# Patient Record
Sex: Female | Born: 1990 | Race: Black or African American | Hispanic: No | Marital: Single | Smoking: Never smoker
Health system: Southern US, Community
[De-identification: ages and names within clinical notes are randomized; demographics above are authoritative.]

## PROBLEM LIST (undated history)

## (undated) ENCOUNTER — Inpatient Hospital Stay (HOSPITAL_COMMUNITY): Payer: Self-pay

## (undated) DIAGNOSIS — D649 Anemia, unspecified: Secondary | ICD-10-CM

## (undated) DIAGNOSIS — D219 Benign neoplasm of connective and other soft tissue, unspecified: Secondary | ICD-10-CM

## (undated) DIAGNOSIS — K219 Gastro-esophageal reflux disease without esophagitis: Secondary | ICD-10-CM

## (undated) DIAGNOSIS — R55 Syncope and collapse: Secondary | ICD-10-CM

## (undated) DIAGNOSIS — T7840XA Allergy, unspecified, initial encounter: Secondary | ICD-10-CM

## (undated) HISTORY — PX: NO PAST SURGERIES: SHX2092

## (undated) HISTORY — DX: Anemia, unspecified: D64.9

## (undated) HISTORY — DX: Allergy, unspecified, initial encounter: T78.40XA

---
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
## 2010-11-21 LAB — POCT I-STAT, CHEM 8
BUN: 8
Calcium, Ion: 1.24
Sodium: 138
TCO2: 27

## 2010-11-21 LAB — URINALYSIS, ROUTINE W REFLEX MICROSCOPIC
Hgb urine dipstick: NEGATIVE
Ketones, ur: NEGATIVE
Specific Gravity, Urine: 1.013

## 2010-11-21 LAB — URINE MICROSCOPIC-ADD ON

## 2010-11-30 ENCOUNTER — Emergency Department (HOSPITAL_COMMUNITY)
Admission: EM | Admit: 2010-11-30 | Discharge: 2010-11-30 | Disposition: A | Discharge status: Home / Self Care | Payer: Self-pay | Attending: Emergency Medicine | Admitting: Emergency Medicine

## 2010-11-30 DIAGNOSIS — IMO0002 Reserved for concepts with insufficient information to code with codable children: Secondary | ICD-10-CM | POA: Insufficient documentation

## 2011-04-26 ENCOUNTER — Other Ambulatory Visit: Payer: Self-pay

## 2011-04-26 ENCOUNTER — Emergency Department (HOSPITAL_COMMUNITY)
Admission: EM | Admit: 2011-04-26 | Discharge: 2011-04-27 | Disposition: A | Discharge status: Home / Self Care | Payer: Self-pay | Attending: Emergency Medicine | Admitting: Emergency Medicine

## 2011-04-26 DIAGNOSIS — E860 Dehydration: Secondary | ICD-10-CM | POA: Insufficient documentation

## 2011-04-26 DIAGNOSIS — R55 Syncope and collapse: Secondary | ICD-10-CM | POA: Insufficient documentation

## 2011-04-26 DIAGNOSIS — R296 Repeated falls: Secondary | ICD-10-CM | POA: Insufficient documentation

## 2011-04-26 DIAGNOSIS — R51 Headache: Secondary | ICD-10-CM | POA: Insufficient documentation

## 2011-04-26 HISTORY — DX: Syncope and collapse: R55

## 2011-04-27 ENCOUNTER — Other Ambulatory Visit: Payer: Self-pay

## 2011-04-27 ENCOUNTER — Encounter (HOSPITAL_COMMUNITY): Payer: Self-pay

## 2011-04-27 ENCOUNTER — Emergency Department (HOSPITAL_COMMUNITY): Payer: Self-pay

## 2011-04-27 LAB — URINALYSIS, DIPSTICK ONLY
Bilirubin Urine: NEGATIVE
Glucose, UA: NEGATIVE mg/dL
Hgb urine dipstick: NEGATIVE
Ketones, ur: NEGATIVE mg/dL
Leukocytes, UA: NEGATIVE
Nitrite: NEGATIVE

## 2011-04-27 LAB — POCT I-STAT, CHEM 8
Calcium, Ion: 1.22 mmol/L (ref 1.12–1.32)
Glucose, Bld: 112 mg/dL — ABNORMAL HIGH (ref 70–99)
Hemoglobin: 13.3 g/dL (ref 12.0–15.0)
TCO2: 25 mmol/L (ref 0–100)

## 2011-04-27 LAB — POCT PREGNANCY, URINE: Preg Test, Ur: NEGATIVE

## 2011-04-27 MED ORDER — SODIUM CHLORIDE 0.9 % IV BOLUS (SEPSIS): 1000.0000 mL | Freq: Once | INTRAVENOUS | Status: DC

## 2011-04-27 MED ORDER — PROMETHAZINE HCL 25 MG PO TABS: 25.0000 mg | ORAL_TABLET | Freq: Four times a day (QID) | ORAL | Status: DC | PRN

## 2011-04-27 NOTE — ED Provider Notes (Addendum)
History     CSN: [id number]  Arrival date & time 04/26/11  2359   First MD Initiated Contact with Patient 04/27/11 0040      Chief Complaint  Patient presents with  . Loss of Consciousness    (Consider location/radiation/quality/duration/timing/severity/associated sxs/prior treatment) HPI Comments: 21 year old female with history of posterior headaches intermittently presents with a syncopal episode. According to the family member she got up off the couch to go to get a drink of water, turned around to the living room and fell face first into the ground. She states that prior to falling she was feeling nauseated, feeling overheated and lightheaded. She denies palpitations, chest pain, shortness of breath, vertigo, weakness or numbness. She was immediately attended to by her family members, immediately woke up and was able to get up off the ground without any complaints. At this time she has a mild posterior headache, no nausea vomiting, no visual changes, no ataxia. Symptoms are mild, persistent, nothing makes better or  Patient is a 21 y.o. female presenting with syncope. The history is provided by the patient and a relative.  Loss of Consciousness    Past Medical History  Diagnosis Date  . Syncope and collapse     History reviewed. No pertinent past surgical history.  No family history on file.  History  Substance Use Topics  . Smoking status: Never Smoker   . Smokeless tobacco: Not on file  . Alcohol Use: Yes    OB History    Grav Para Term Preterm Abortions TAB SAB Ect Mult Living                  Review of Systems  Cardiovascular: Positive for syncope.  All other systems reviewed and are negative.    Allergies  Review of patient's allergies indicates no known allergies.  Home Medications   Current Outpatient Rx  Name Route Sig Dispense Refill  . PROMETHAZINE HCL 25 MG PO TABS Oral Take 1 tablet (25 mg total) by mouth every 6 (six) hours as needed for  nausea. 12 tablet 0    BP 111/72  Pulse 94  Temp(Src) 98.6 F (37 C) (Oral)  Resp 18  Wt 209 lb (94.802 kg)  SpO2 100%  LMP 04/21/2011  Physical Exam  Nursing note and vitals reviewed. Constitutional: She appears well-developed and well-nourished. No distress.  HENT:  Head: Normocephalic and atraumatic.  Mouth/Throat: Oropharynx is clear and moist. No oropharyngeal exudate.       Mild tenderness to the nasal bridge, no deformity swelling or bruising  Eyes: Conjunctivae and EOM are normal. Pupils are equal, round, and reactive to light. Right eye exhibits no discharge. Left eye exhibits no discharge. No scleral icterus.  Neck: Normal range of motion. Neck supple. No JVD present. No thyromegaly present.  Cardiovascular: Normal rate, regular rhythm, normal heart sounds and intact distal pulses.  Exam reveals no gallop and no friction rub.   No murmur heard. Pulmonary/Chest: Effort normal and breath sounds normal. No respiratory distress. She has no wheezes. She has no rales.  Abdominal: Soft. Bowel sounds are normal. She exhibits no distension and no mass. There is no tenderness.  Musculoskeletal: Normal range of motion. She exhibits no edema and no tenderness.  Lymphadenopathy:    She has no cervical adenopathy.  Neurological: She is alert. Coordination normal.  Skin: Skin is warm and dry. No rash noted. No erythema.  Psychiatric: She has a normal mood and affect. Her behavior is normal.  ED Course  Procedures (including critical care time)  ED ECG REPORT   Date: 04/27/2011   Rate: 85  Rhythm: normal sinus rhythm  QRS Axis: normal  Intervals: normal  ST/T Wave abnormalities: normal  Conduction Disutrbances:none  Narrative Interpretation:   Old EKG Reviewed: none available     Labs Reviewed  POCT I-STAT, CHEM 8 - Abnormal; Notable for the following:    Glucose, Bld 112 (*)    All other components within normal limits  URINALYSIS, DIPSTICK ONLY  POCT PREGNANCY,  URINE   Ct Head Wo Contrast  04/27/2011  *RADIOLOGY REPORT*  Clinical Data: Head and face trauma secondary to a fall due to syncope.  CT HEAD WITHOUT CONTRAST  Technique:  Contiguous axial images were obtained from the base of the skull through the vertex without contrast.  Comparison: None.  Findings: There is no acute intracranial hemorrhage, infarction, or mass.  Brain parenchyma is normal.  Osseous structures are normal.  IMPRESSION: Normal exam.  Original Report Authenticated By: Gwynn Burly, M.D.     1. Dehydration   2. Syncope       MDM  Exam is benign, has mild tenderness to the nasal bridge but doubt fracture. Sounds like she may have had a vagal episode given the symptoms starting as she stood up. We'll proceed with labs, U pregnant, CT head at mother's insistence.  Patient reevaluated and feels good, has slight orthostatic change when stands but declines IV fluids preferring oral rehydration therapy instead. I have personally reviewed her CT scan and labwork and find a significant abnormalities, with normal electrolytes, renal function, specific gravity, CT head.  She will followup with family doctor as needed.      Vida Roller, MD 04/27/11 0210  Vida Roller, MD 04/27/11 [phone]

## 2011-04-27 NOTE — Discharge Instructions (Signed)
That you had a passing out spell was because you are slightly dehydrated. Please slowly get to a standing position, if you feel lightheaded sat down immediately and drink plenty of fluids over the next 24 hours. Return to the emergency department for severe or worsening symptoms including recurrent passing out, chest pain, palpitations  RESOURCE GUIDE  Dental Problems  Patients with Medicaid: [facility] [phone]                                           [phone] [address] Phone:  [phone]                                                  Phone:  [phone]  If unable to pay or uninsured, contact:  [facility] or [facility]. to become qualified for the adult dental clinic.  Chronic Pain Problems Contact [facility]  [phone] Patients need to be referred by their primary care doctor.  Insufficient Money for Medicine Contact [employer]:  call "211" or [facility] [phone].  No Primary Care Doctor Call [facility]  [phone] Other agencies that provide inexpensive medical care    [facility]  [phone]    [facility]  [phone]    [facility]  [phone]    [facility]  [phone]    [facility]  [phone]    [facility]  [phone]  Psychological Services [facility]  [phone] [facility]  [phone] [facility]   [phone] (emergency services [phone])  Substance Abuse Resources [facility]  [phone] [facility] [phone] [facility] [phone] Floydene Flock [phone] Residential & Outpatient Substance Abuse Program  [phone]  Abuse/Neglect [facility] Child Abuse Hotline [phone])446-6411 [facility] Child Abuse Hotline (202) 367-6142 (After Hours)  Emergency Shelter [facility] (512)[phone]  Maternity Homes Room at the [place] [facility] [place] [phone] [facility] [phone]  MRSA Hotline #:   815 100 6054    [facility] Resources  [facility]     [employer]                          [facility] Dept. [address] [place]                       [address]      [address] [place] [address]  [place]                                                Cristobal Goldmann Phone:  [phone]  Phone:  [phone]                 Phone:  [phone]  [place] [facility] Phone:  [phone]  [place] Child Abuse Hotline (336) 342-1394 [phone]) [phone] (After Hours)   

## 2011-04-27 NOTE — ED Notes (Signed)
Mother states pt was asleep in chair awoke and walked into the kitchen and when walking back "passed out"  approx 1 minute- denies neck and back pain and N/V/d and fever

## 2011-04-27 NOTE — ED Notes (Signed)
Pt reports history of syncope about 50 minutes ago. Pt reports she does not remember what happened. Pt says this happened about a year ago when she was working out a lot. Pt reports she had an episode of syncope and was seen and diagnosed with dehydration. Pt denies exercise today. Pt reports pain to nose and head. Pt reports falling on face with episode of syncope. Pt reports she has had a stressful and busy weekend in which she bought a car. Pt family denies any personality changes, changes in speech or thought. Pt had a negative neuro exam and able to ambulate without assistance.

## 2011-09-25 ENCOUNTER — Encounter (HOSPITAL_COMMUNITY): Payer: Self-pay | Admitting: Emergency Medicine

## 2011-09-25 ENCOUNTER — Emergency Department (HOSPITAL_COMMUNITY)
Admission: EM | Admit: 2011-09-25 | Discharge: 2011-09-25 | Disposition: A | Discharge status: Home / Self Care | Payer: Medicaid Other | Attending: Emergency Medicine | Admitting: Emergency Medicine

## 2011-09-25 DIAGNOSIS — L732 Hidradenitis suppurativa: Secondary | ICD-10-CM | POA: Insufficient documentation

## 2011-09-25 MED ORDER — OXYCODONE-ACETAMINOPHEN 5-325 MG PO TABS: 1.0000 | ORAL_TABLET | Freq: Four times a day (QID) | ORAL | Status: DC | PRN

## 2011-09-25 MED ORDER — CEPHALEXIN 500 MG PO CAPS: 500.0000 mg | ORAL_CAPSULE | Freq: Four times a day (QID) | ORAL | Status: DC

## 2011-09-25 MED ORDER — SULFAMETHOXAZOLE-TRIMETHOPRIM 800-160 MG PO TABS: 1.0000 | ORAL_TABLET | Freq: Three times a day (TID) | ORAL | Status: DC

## 2011-09-25 NOTE — ED Provider Notes (Addendum)
History     CSN: [id number]  Arrival date & time 09/25/11  1103   First MD Initiated Contact with Patient 09/25/11 1125      Chief Complaint  Patient presents with  . Abscess    (Consider location/radiation/quality/duration/timing/severity/associated sxs/prior treatment) The history is provided by the patient.  patient with painful swelling to the bilateral axilla for the last few days. History of abscesses previously. No fever. No drainage. No other sites.   Past Medical History  Diagnosis Date  . Syncope and collapse     History reviewed. No pertinent past surgical history.  History reviewed. No pertinent family history.  History  Substance Use Topics  . Smoking status: Never Smoker   . Smokeless tobacco: Not on file  . Alcohol Use: Yes    OB History    Grav Para Term Preterm Abortions TAB SAB Ect Mult Living                  Review of Systems  Constitutional: Negative for fever and fatigue.  Respiratory: Negative for chest tightness.   Cardiovascular: Negative for chest pain.  Musculoskeletal: Negative for back pain.  Skin: Negative for wound.  Hematological: Positive for adenopathy.    Allergies  Review of patient's allergies indicates no known allergies.  Home Medications   Current Outpatient Rx  Name Route Sig Dispense Refill  . ADULT MULTIVITAMIN W/MINERALS CH Oral Take 1 tablet by mouth daily.    . CEPHALEXIN 500 MG PO CAPS Oral Take 1 capsule (500 mg total) by mouth 4 (four) times daily. 20 capsule 0  . OXYCODONE-ACETAMINOPHEN 5-325 MG PO TABS Oral Take 1-2 tablets by mouth every 6 (six) hours as needed for pain. 10 tablet 0  . PROMETHAZINE HCL 25 MG PO TABS Oral Take 25 mg by mouth every 6 (six) hours as needed. For nausea    . SULFAMETHOXAZOLE-TRIMETHOPRIM 800-160 MG PO TABS Oral Take 1 tablet by mouth 3 (three) times daily. 15 tablet 0    BP 109/74  Pulse 103  Temp 97.9 F (36.6 C)  Resp 18  SpO2 100%  Physical Exam  Constitutional:  She appears well-developed and well-nourished.  Pulmonary/Chest: She exhibits no tenderness.  Skin:       Tender indurated areas to bilateral axilla. No fluctuance. Scarring from previous abscesses    ED Course  Procedures (including critical care time)  Labs Reviewed - No data to display No results found.   1. Hidradenitis suppurativa       MDM  Patient with apparent hidradenitis bilateral axilla. No clear abscess any draining. She has a history of same. Antibiotics and pain medicine will be given. She'll followup with [facility] and the ER in 2-3 days as needed for recheck.  I personally performed the services described in this documentation, which was scribed in my presence. The recorded information has been reviewed and considered.          Juliet Rude. Rubin Payor, MD 09/25/11 1156  Juliet Rude. Rubin Payor, MD 11/02/11 2051

## 2011-09-25 NOTE — ED Notes (Signed)
Pt c/o multiple abscess under axillary area; pt sts hx of same

## 2011-10-01 ENCOUNTER — Encounter (HOSPITAL_COMMUNITY): Payer: Self-pay | Admitting: Adult Health

## 2011-10-01 ENCOUNTER — Emergency Department (HOSPITAL_COMMUNITY)
Admission: EM | Admit: 2011-10-01 | Discharge: 2011-10-02 | Disposition: A | Discharge status: Home / Self Care | Payer: Medicaid Other | Attending: Emergency Medicine | Admitting: Emergency Medicine

## 2011-10-01 DIAGNOSIS — L02412 Cutaneous abscess of left axilla: Secondary | ICD-10-CM

## 2011-10-01 DIAGNOSIS — IMO0002 Reserved for concepts with insufficient information to code with codable children: Secondary | ICD-10-CM | POA: Insufficient documentation

## 2011-10-01 MED ORDER — OXYCODONE-ACETAMINOPHEN 5-325 MG PO TABS
2.0000 | ORAL_TABLET | Freq: Once | ORAL | Status: AC
  Administered 2011-10-01: 2 via ORAL
  Filled 2011-10-01: qty 2

## 2011-10-01 NOTE — ED Provider Notes (Signed)
History     CSN: [id number]  Arrival date & time 10/01/11  2301   None     Chief Complaint  Patient presents with  . Abscess    (Consider location/radiation/quality/duration/timing/severity/associated sxs/prior treatment) HPI Comments: Patient has a moderate-sized abscess in her left axilla.  She states she had one in the same area proximally, one year ago.  He denies any trauma, fever, myalgias, drainage  Patient is a 21 y.o. female presenting with abscess. The history is provided by the patient.  Abscess  This is a new problem. The current episode started yesterday. The problem occurs rarely. The problem has been unchanged. Pertinent negatives include no fever.    Past Medical History  Diagnosis Date  . Syncope and collapse     History reviewed. No pertinent past surgical history.  History reviewed. No pertinent family history.  History  Substance Use Topics  . Smoking status: Never Smoker   . Smokeless tobacco: Not on file  . Alcohol Use: Yes    OB History    Grav Para Term Preterm Abortions TAB SAB Ect Mult Living                  Review of Systems  Constitutional: Negative for fever and chills.  Gastrointestinal: Negative for nausea.  Musculoskeletal: Negative for myalgias.  Skin: Positive for wound.    Allergies  Review of patient's allergies indicates no known allergies.  Home Medications   Current Outpatient Rx  Name Route Sig Dispense Refill  . IBUPROFEN 200 MG PO TABS Oral Take 800 mg by mouth every 6 (six) hours as needed. For pain    . ADULT MULTIVITAMIN W/MINERALS CH Oral Take 1 tablet by mouth daily.    Marland Kitchen PROMETHAZINE HCL 25 MG PO TABS Oral Take 25 mg by mouth every 6 (six) hours as needed. For nausea      BP 106/62  Pulse 85  Temp 98.5 F (36.9 C) (Oral)  Resp 16  SpO2 100%  Physical Exam  Constitutional: She appears well-developed and well-nourished.  HENT:  Head: Normocephalic.  Eyes: Pupils are equal, round, and reactive to  light.  Neck: Normal range of motion.  Cardiovascular: Normal rate.   Pulmonary/Chest: Effort normal.  Abdominal: Soft.  Musculoskeletal: Normal range of motion.  Neurological: She is alert.  Skin: Skin is warm.       Small fluctuant abscess, central left axilla    ED Course  INCISION AND DRAINAGE Date/Time: 10/02/2011 12:22 AM Performed by: Arman Filter Authorized by: Arman Filter Consent: Verbal consent obtained. Risks and benefits: risks, benefits and alternatives were discussed Consent given by: patient Patient understanding: patient states understanding of the procedure being performed Patient identity confirmed: verbally with patient Type: abscess Body area: upper extremity Location details: left arm Anesthesia: local infiltration Local anesthetic: lidocaine 2% with epinephrine Anesthetic total: 3 ml Scalpel size: 11 Needle gauge: 22 Complexity: simple Drainage: purulent Drainage amount: copious Wound treatment: drain placed Packing material: 1/4 in iodoform gauze Patient tolerance: Patient tolerated the procedure well with no immediate complications.   (including critical care time)  Labs Reviewed - No data to display No results found.   No diagnosis found.    MDM   Abscess.  Will be I indeed, patient was placed on antibiotics.  She is instructed to hold the packing in 2 days        Arman Filter, NP 10/02/11 4098

## 2011-10-01 NOTE — ED Notes (Signed)
Golf ball sized induration that is under left arm. Yesterday it was 3 small bumps and today it is one. Pt c/o pain 8/10

## 2011-10-01 NOTE — ED Notes (Signed)
Patient complaining of abscess under left arm (axilla rea); patient reports that abscess and pain started yesterday -- abscess has greatly increased in size since last night.  Patient describes pain as "sore" and "tender"; rates pain 8/10 on the numerical pain scale.  Will continue to monitor.

## 2011-10-02 MED ORDER — HYDROCODONE-ACETAMINOPHEN 5-500 MG PO TABS: 1.0000 | ORAL_TABLET | Freq: Four times a day (QID) | ORAL | Status: AC | PRN

## 2011-10-02 NOTE — ED Provider Notes (Signed)
Medical screening examination/treatment/procedure(s) were performed by non-physician practitioner and as supervising physician I was immediately available for consultation/collaboration.  Jasmine Awe, MD 10/02/11 [phone]

## 2011-10-02 NOTE — ED Notes (Signed)
Patient given discharge paperwork; went over discharge instructions with patient.  Patient instructed to remove packing in two days (per FNP), to follow up with primary care physician, and to return to the ED for new, worsening, or concerning symptoms.

## 2012-12-21 ENCOUNTER — Ambulatory Visit (INDEPENDENT_AMBULATORY_CARE_PROVIDER_SITE_OTHER): Payer: BC Managed Care – PPO | Admitting: Physician Assistant

## 2012-12-21 VITALS — BP 112/62 | HR 84 | Temp 98.6°F | Resp 18 | Ht 68.0 in | Wt 227.0 lb

## 2012-12-21 DIAGNOSIS — R062 Wheezing: Secondary | ICD-10-CM

## 2012-12-21 DIAGNOSIS — R059 Cough, unspecified: Secondary | ICD-10-CM

## 2012-12-21 DIAGNOSIS — R05 Cough: Secondary | ICD-10-CM

## 2012-12-21 DIAGNOSIS — J329 Chronic sinusitis, unspecified: Secondary | ICD-10-CM

## 2012-12-21 MED ORDER — CLARITHROMYCIN ER 500 MG PO TB24: 1000.0000 mg | ORAL_TABLET | Freq: Every day | ORAL | Status: DC

## 2012-12-21 MED ORDER — BENZONATATE 100 MG PO CAPS: 100.0000 mg | ORAL_CAPSULE | Freq: Three times a day (TID) | ORAL | Status: DC | PRN

## 2012-12-21 MED ORDER — IPRATROPIUM BROMIDE 0.02 % IN SOLN
0.5000 mg | Freq: Once | RESPIRATORY_TRACT | Status: AC
  Administered 2012-12-21: 0.5 mg via RESPIRATORY_TRACT

## 2012-12-21 MED ORDER — IPRATROPIUM BROMIDE 0.03 % NA SOLN: 2.0000 | Freq: Two times a day (BID) | NASAL | Status: DC

## 2012-12-21 MED ORDER — ALBUTEROL SULFATE HFA 108 (90 BASE) MCG/ACT IN AERS: 2.0000 | INHALATION_SPRAY | RESPIRATORY_TRACT | Status: DC | PRN

## 2012-12-21 MED ORDER — ALBUTEROL SULFATE (2.5 MG/3ML) 0.083% IN NEBU
2.5000 mg | INHALATION_SOLUTION | Freq: Once | RESPIRATORY_TRACT | Status: AC
  Administered 2012-12-21: 2.5 mg via RESPIRATORY_TRACT

## 2012-12-21 NOTE — Patient Instructions (Signed)
Get plenty of rest and drink at least 64 ounces of water daily. 

## 2012-12-21 NOTE — Progress Notes (Signed)
Subjective:    Patient ID: Tiffany Sullivan, female    DOB: 09/03/1990, 22 y.o.   MRN: [id number]  HPI This 22 y.o. female presents for evaluation of respiratory illness. Developed subjective generalized "weakness" and fatigue after work on 12/16/2012.  Then developed cough and congestion.  Cough is productive.  Initially had a sore throat, which has resolved.  Today when she got up, she felt like she was wheezing and couldn't catch a breath.  She was seen at the [facility] on 12/19/2012 and was told she had a virus, a "cold," and to continue OTC Mucinex.    Active Ambulatory Problems    Diagnosis Date Noted  . No Active Ambulatory Problems   Resolved Ambulatory Problems    Diagnosis Date Noted  . No Resolved Ambulatory Problems   Past Medical History  Diagnosis Date  . Syncope and collapse   . Allergy   . Anemia     History reviewed. No pertinent past surgical history.  No Known Allergies  Prior to Admission medications   Medication Sig Start Date End Date Taking? Authorizing Provider  Fexofenadine HCl (MUCINEX ALLERGY PO) Take by mouth.   Yes Historical Provider, MD  Multiple Vitamin (MULTIVITAMIN WITH MINERALS) TABS Take 1 tablet by mouth daily.   Yes Historical Provider, MD    History   Social History  . Marital Status: Single    Spouse Name: n/a    Number of Children: 0  . Years of Education: N/A   Occupational History  . cafeteria    Social History Main Topics  . Smoking status: Never Smoker   . Smokeless tobacco: None  . Alcohol Use: Yes  . Drug Use: No  . Sexual Activity: None   Other Topics Concern  . None   Social History Narrative   Lives with 3 roommates. Family lives in [place], [place].  She lives in [place], [place]    family history is not on file. indicated that her mother is alive. She indicated that her father is alive. She indicated that all of her five sisters are alive. She indicated that her brother is alive.   Review of  Systems As above    Objective:   Physical Exam  Vitals reviewed. Constitutional: She is oriented to person, place, and time. Vital signs are normal. She appears well-developed and well-nourished. She is active and cooperative. No distress.  HENT:  Head: Normocephalic and atraumatic.  Right Ear: Hearing, tympanic membrane, external ear and ear canal normal.  Left Ear: Hearing, tympanic membrane, external ear and ear canal normal.  Nose: Mucosal edema and rhinorrhea present.  No foreign bodies. Right sinus exhibits no maxillary sinus tenderness and no frontal sinus tenderness. Left sinus exhibits no maxillary sinus tenderness and no frontal sinus tenderness.  Mouth/Throat: Uvula is midline, oropharynx is clear and moist and mucous membranes are normal. No uvula swelling. No oropharyngeal exudate.  Eyes: Conjunctivae and EOM are normal. Pupils are equal, round, and reactive to light. Right eye exhibits no discharge. Left eye exhibits no discharge. No scleral icterus.  Neck: Trachea normal, normal range of motion and full passive range of motion without pain. Neck supple. No mass and no thyromegaly present.  Cardiovascular: Normal rate, regular rhythm and normal heart sounds.   Pulmonary/Chest: Effort normal. She has wheezes (high pitched and musical, with considerable improvement after albuterol + atrovent neb; also subjective symptom improvement).  Lymphadenopathy:       Head (right side): No submandibular, no tonsillar, no preauricular,  no posterior auricular and no occipital adenopathy present.       Head (left side): No submandibular, no tonsillar, no preauricular and no occipital adenopathy present.    She has no cervical adenopathy.       Right: No supraclavicular adenopathy present.       Left: No supraclavicular adenopathy present.  Neurological: She is alert and oriented to person, place, and time. She has normal strength. No cranial nerve deficit or sensory deficit.  Skin: Skin is  warm, dry and intact. No rash noted.  Psychiatric: She has a normal mood and affect. Her speech is normal and behavior is normal.          Assessment & Plan:  Sinusitis - Plan: ipratropium (ATROVENT) 0.03 % nasal spray, clarithromycin (BIAXIN XL) 500 MG 24 hr tablet  Wheezing - Plan: albuterol (PROVENTIL) (2.5 MG/3ML) 0.083% nebulizer solution 2.5 mg, ipratropium (ATROVENT) nebulizer solution 0.5 mg, albuterol (PROVENTIL HFA;VENTOLIN HFA) 108 (90 BASE) MCG/ACT inhaler  Cough - Plan: benzonatate (TESSALON) 100 MG capsule  Supportive care.  Anticipatory guidance.  RTC if symptoms worsen/persist.  Fernande Bras, PA-C Physician Assistant-Certified [facility]

## 2013-11-28 ENCOUNTER — Encounter (HOSPITAL_COMMUNITY): Payer: Self-pay | Admitting: *Deleted

## 2013-11-28 ENCOUNTER — Inpatient Hospital Stay (HOSPITAL_COMMUNITY)
Admission: AD | Admit: 2013-11-28 | Discharge: 2013-11-28 | Disposition: A | Discharge status: Home / Self Care | Payer: BC Managed Care – PPO | Source: Ambulatory Visit | Attending: Obstetrics & Gynecology | Admitting: Obstetrics & Gynecology

## 2013-11-28 DIAGNOSIS — M546 Pain in thoracic spine: Secondary | ICD-10-CM | POA: Diagnosis not present

## 2013-11-28 DIAGNOSIS — M549 Dorsalgia, unspecified: Secondary | ICD-10-CM | POA: Insufficient documentation

## 2013-11-28 DIAGNOSIS — F17200 Nicotine dependence, unspecified, uncomplicated: Secondary | ICD-10-CM | POA: Insufficient documentation

## 2013-11-28 DIAGNOSIS — R109 Unspecified abdominal pain: Secondary | ICD-10-CM | POA: Diagnosis not present

## 2013-11-28 DIAGNOSIS — R1030 Lower abdominal pain, unspecified: Secondary | ICD-10-CM

## 2013-11-28 HISTORY — DX: Gastro-esophageal reflux disease without esophagitis: K21.9

## 2013-11-28 LAB — COMPREHENSIVE METABOLIC PANEL
ALK PHOS: 62 U/L (ref 39–117)
ALT: 13 U/L (ref 0–35)
AST: 13 U/L (ref 0–37)
Albumin: 3.6 g/dL (ref 3.5–5.2)
Anion gap: 13 (ref 5–15)
BUN: 10 mg/dL (ref 6–23)
CHLORIDE: 102 meq/L (ref 96–112)
CO2: 23 mEq/L (ref 19–32)
Calcium: 9.2 mg/dL (ref 8.4–10.5)
Creatinine, Ser: 0.84 mg/dL (ref 0.50–1.10)
GFR calc Af Amer: >90 mL/min (ref >90)
GFR calc non Af Amer: >90 mL/min (ref >90)
Glucose, Bld: 100 mg/dL — ABNORMAL HIGH (ref 70–99)
Potassium: 3.7 mEq/L (ref 3.7–5.3)
SODIUM: 138 meq/L (ref 137–147)
TOTAL PROTEIN: 7.3 g/dL (ref 6.0–8.3)
Total Bilirubin: 0.4 mg/dL (ref 0.3–1.2)

## 2013-11-28 LAB — URINALYSIS, ROUTINE W REFLEX MICROSCOPIC
Bilirubin Urine: NEGATIVE
Glucose, UA: NEGATIVE mg/dL
Hgb urine dipstick: NEGATIVE
Ketones, ur: NEGATIVE mg/dL
Leukocytes, UA: NEGATIVE
NITRITE: NEGATIVE
Protein, ur: NEGATIVE mg/dL
Specific Gravity, Urine: 1.015 (ref 1.005–1.030)
Urobilinogen, UA: 2 mg/dL — ABNORMAL HIGH (ref 0.0–1.0)
pH: 7.5 (ref 5.0–8.0)

## 2013-11-28 LAB — POCT PREGNANCY, URINE: PREG TEST UR: NEGATIVE

## 2013-11-28 LAB — CBC
HCT: 37.1 % (ref 36.0–46.0)
HEMOGLOBIN: 11.9 g/dL — AB (ref 12.0–15.0)
MCH: 25.2 pg — AB (ref 26.0–34.0)
MCHC: 32.1 g/dL (ref 30.0–36.0)
MCV: 78.6 fL (ref 78.0–100.0)
Platelets: 285 10*3/uL (ref 150–400)
RBC: 4.72 MIL/uL (ref 3.87–5.11)
RDW: 14.9 % (ref 11.5–15.5)
WBC: 10.6 10*3/uL — ABNORMAL HIGH (ref 4.0–10.5)

## 2013-11-28 MED ORDER — NAPROXEN 500 MG PO TABS: 500.0000 mg | ORAL_TABLET | Freq: Two times a day (BID) | ORAL | Status: DC

## 2013-11-28 NOTE — MAU Note (Signed)
Pt c/o abdominal and back pain that started about 3 months. Back pain is right side under ribs, abdominal pain under umbilicus bilaterally. Also has several boils in axilla and on legs.

## 2013-11-28 NOTE — MAU Provider Note (Signed)
History     CSN: [id number]  Arrival date and time: 11/28/13 1834   None     Chief Complaint  Patient presents with  . Abdominal Pain  . Flank Pain   HPI 47F G0, smoker, presenting with abdominal "discomfort" and localized back pain.  With regard to abdominal pain, first noticed it 2-3 months ago. Center abdominal. Does not radiate. Nothing seems to make better or worse except perhaps more uncomfortable to lie on stomach. No nausea or vomiting. No diarrhea or constipation. No dysuria or urinary frequency. Not associated with eating. Came in tonight because just recently obtained insurance and doesn't yet have PCP. No vaginal discharge, pain, burning, or itching. No vaginal bleeding, LMP finished 9/20. Does get occasional GERD symptoms.  Back: focal area right mid-back that is tender. Present for 6 months. Mild, also a "discomfort." Doesn't seem to be relieved or aggravated by anything. Does not radiate. No history back trauma. No weakness or numbness.  Past Medical History  Diagnosis Date  . Syncope and collapse   . Allergy   . Anemia   . GERD (gastroesophageal reflux disease)     History reviewed. No pertinent past surgical history.  No family history on file.  History  Substance Use Topics  . Smoking status: Current Every Day Smoker  . Smokeless tobacco: Not on file  . Alcohol Use: Yes     Comment: occasional (every other weekend)    Allergies: No Known Allergies  Prescriptions prior to admission  Medication Sig Dispense Refill  . albuterol (PROVENTIL HFA;VENTOLIN HFA) 108 (90 BASE) MCG/ACT inhaler Inhale 2 puffs into the lungs every 4 (four) hours as needed for wheezing (cough, shortness of breath or wheezing.).  1 Inhaler  1  . benzonatate (TESSALON) 100 MG capsule Take 1-2 capsules (100-200 mg total) by mouth 3 (three) times daily as needed for cough.  40 capsule  0  . clarithromycin (BIAXIN XL) 500 MG 24 hr tablet Take 2 tablets (1,000 mg total) by mouth daily.   20 tablet  0  . Fexofenadine HCl (MUCINEX ALLERGY PO) Take by mouth.      Marland Kitchen ipratropium (ATROVENT) 0.03 % nasal spray Place 2 sprays into the nose 2 (two) times daily.  30 mL  0  . Multiple Vitamin (MULTIVITAMIN WITH MINERALS) TABS Take 1 tablet by mouth daily.        Review of Systems  Constitutional: Negative for fever, chills and weight loss.  Respiratory: Negative for cough.   Cardiovascular: Negative for chest pain and palpitations.  Gastrointestinal: Positive for abdominal pain. Negative for heartburn, nausea, vomiting, diarrhea, constipation and blood in stool.  Genitourinary: Negative for dysuria, urgency, frequency, hematuria and flank pain.  Musculoskeletal: Positive for back pain. Negative for falls, joint pain, myalgias and neck pain.  Skin: Negative for rash.  Neurological: Negative for dizziness and headaches.   Physical Exam   Blood pressure 132/74, pulse 159, temperature 98.6 F (37 C), temperature source Oral, height 5' 7.5" (1.715 m), weight 107.956 kg (238 lb), last menstrual period 11/06/2013.  Physical Exam  Constitutional: She appears well-developed and well-nourished. No distress.  HENT:  Head: Normocephalic and atraumatic.  Eyes: Conjunctivae are normal.  Neck: Normal range of motion.  Cardiovascular: Normal rate and regular rhythm.  Exam reveals no gallop and no friction rub.   No murmur heard. (elevated HR documented in triage, but RRR throughout this provider's interview and exam)  Respiratory: Effort normal and breath sounds normal. No respiratory distress. She exhibits  no tenderness.  GI: Soft. Bowel sounds are normal. She exhibits no distension and no mass. There is tenderness. There is no rebound and no guarding.  Mild ttp just lateral and inferior to the umbilicus  Neurological: She is alert.  Skin: Skin is warm and dry.  Psychiatric: She has a normal mood and affect.    MAU Course  Procedures    Assessment and Plan  54F presents with  abdominal pain and back pain.  # Abdominal pain - Upreg negative, no fever or urinary complaints to suggest UTI, no vomiting or diarrhea to suggest gastroenteritis, no epigastric pain or substernal pain to suggest GERD, no pelvic pain to suggest PID, not cyclic to suggest menstrual etiology, no vaginal symptoms to suggest vaginal infection. Etiology thus unclear, but given that problem is chronic and very mild and not currently present, we will further evaluate here with CBC and CMP. Has initial gynecology visit November 2nd, and should symptoms persist we recommend f/u with that provider or establishing with a PCP.  # Musculoskeletal back pain - also mild and chronic in nature. Focal area of tenderness over right posterior inferior rib. No trauma. No other urinary infectious symptoms. Will offer patient prescription-strength naproxen and recommend establishing with a PCP. Will discharge with Naproxen 500 mg po bid prn.  Sign-out given to [place] @ 8:03 PM  [place], [place] 11/28/2013, 7:18 PM     Medication List    STOP taking these medications       benzonatate 100 MG capsule  Commonly known as:  TESSALON     clarithromycin 500 MG 24 hr tablet  Commonly known as:  BIAXIN XL     ipratropium 0.03 % nasal spray  Commonly known as:  ATROVENT     MUCINEX ALLERGY PO     multivitamin with minerals Tabs tablet      TAKE these medications       naproxen 500 MG tablet  Commonly known as:  NAPROSYN  Take 1 tablet (500 mg total) by mouth 2 (two) times daily with a meal.       Follow-up Information   Follow up with [facility] On 01/03/2014. (Keep your scheduled appointment)    Contact information:   [address] [place] [address] [phone]      Please follow up. (You can also consider establishing care with a primary care physician)       Follow up with [facility]. (Keep your scheduled appointment)    Contact information:   [place] [address] [phone]    Work note given  Evaluation and management procedures were performed by Resident physician under my supervision/collaboration. Chart reviewed, patient examined by me and I agree with management and plan. Lorene Dy, CNM 11/28/2013 8:54 PM

## 2013-11-28 NOTE — MAU Note (Signed)
Pt presents to MAU with c/o upper/mid abdominal pain and right side/flank pain for 3 months.

## 2013-11-28 NOTE — Discharge Instructions (Signed)
Abdominal Pain, Women °Abdominal (stomach, pelvic, or belly) pain can be caused by many things. It is important to tell your doctor: °· The location of the pain. °· Does it come and go or is it present all the time? °· Are there things that start the pain (eating certain foods, exercise)? °· Are there other symptoms associated with the pain (fever, nausea, vomiting, diarrhea)? °All of this is helpful to know when trying to find the cause of the pain. °CAUSES  °· Stomach: virus or bacteria infection, or ulcer. °· Intestine: appendicitis (inflamed appendix), regional ileitis (Crohn's disease), ulcerative colitis (inflamed colon), irritable bowel syndrome, diverticulitis (inflamed diverticulum of the colon), or cancer of the stomach or intestine. °· Gallbladder disease or stones in the gallbladder. °· Kidney disease, kidney stones, or infection. °· Pancreas infection or cancer. °· Fibromyalgia (pain disorder). °· Diseases of the female organs: °¨ Uterus: fibroid (non-cancerous) tumors or infection. °¨ Fallopian tubes: infection or tubal pregnancy. °¨ Ovary: cysts or tumors. °¨ Pelvic adhesions (scar tissue). °¨ Endometriosis (uterus lining tissue growing in the pelvis and on the pelvic organs). °¨ Pelvic congestion syndrome (female organs filling up with blood just before the menstrual period). °¨ Pain with the menstrual period. °¨ Pain with ovulation (producing an egg). °¨ Pain with an IUD (intrauterine device, birth control) in the uterus. °¨ Cancer of the female organs. °· Functional pain (pain not caused by a disease, may improve without treatment). °· Psychological pain. °· Depression. °DIAGNOSIS  °Your doctor will decide the seriousness of your pain by doing an examination. °· Blood tests. °· X-rays. °· Ultrasound. °· CT scan (computed tomography, special type of X-ray). °· MRI (magnetic resonance imaging). °· Cultures, for infection. °· Barium enema (dye inserted in the large intestine, to better view it with  X-rays). °· Colonoscopy (looking in intestine with a lighted tube). °· Laparoscopy (minor surgery, looking in abdomen with a lighted tube). °· Major abdominal exploratory surgery (looking in abdomen with a large incision). °TREATMENT  °The treatment will depend on the cause of the pain.  °· Many cases can be observed and treated at home. °· Over-the-counter medicines recommended by your caregiver. °· Prescription medicine. °· Antibiotics, for infection. °· Birth control pills, for painful periods or for ovulation pain. °· Hormone treatment, for endometriosis. °· Nerve blocking injections. °· Physical therapy. °· Antidepressants. °· Counseling with a psychologist or psychiatrist. °· Minor or major surgery. °HOME CARE INSTRUCTIONS  °· Do not take laxatives, unless directed by your caregiver. °· Take over-the-counter pain medicine only if ordered by your caregiver. Do not take aspirin because it can cause an upset stomach or bleeding. °· Try a clear liquid diet (broth or water) as ordered by your caregiver. Slowly move to a bland diet, as tolerated, if the pain is related to the stomach or intestine. °· Have a thermometer and take your temperature several times a day, and record it. °· Bed rest and sleep, if it helps the pain. °· Avoid sexual intercourse, if it causes pain. °· Avoid stressful situations. °· Keep your follow-up appointments and tests, as your caregiver orders. °· If the pain does not go away with medicine or surgery, you may try: °¨ Acupuncture. °¨ Relaxation exercises (yoga, meditation). °¨ Group therapy. °¨ Counseling. °SEEK MEDICAL CARE IF:  °· You notice certain foods cause stomach pain. °· Your home care treatment is not helping your pain. °· You need stronger pain medicine. °· You want your IUD removed. °· You feel faint or   lightheaded. °· You develop nausea and vomiting. °· You develop a rash. °· You are having side effects or an allergy to your medicine. °SEEK IMMEDIATE MEDICAL CARE IF:  °· Your  pain does not go away or gets worse. °· You have a fever. °· Your pain is felt only in portions of the abdomen. The right side could possibly be appendicitis. The left lower portion of the abdomen could be colitis or diverticulitis. °· You are passing blood in your stools (bright red or black tarry stools, with or without vomiting). °· You have blood in your urine. °· You develop chills, with or without a fever. °· You pass out. °MAKE SURE YOU:  °· Understand these instructions. °· Will watch your condition. °· Will get help right away if you are not doing well or get worse. °Document Released: 12/07/2006 Document Revised: 06/26/2013 Document Reviewed: 12/27/2008 °ExitCare® Patient Information ©2015 [employer]. This information is not intended to replace advice given to you by your health care provider. Make sure you discuss any questions you have with your health care provider. ° °

## 2014-01-03 ENCOUNTER — Ambulatory Visit: Payer: BC Managed Care – PPO | Admitting: Obstetrics

## 2015-01-18 ENCOUNTER — Encounter (HOSPITAL_COMMUNITY): Payer: Self-pay | Admitting: Emergency Medicine

## 2015-01-18 ENCOUNTER — Emergency Department (HOSPITAL_COMMUNITY)
Admission: EM | Admit: 2015-01-18 | Discharge: 2015-01-18 | Disposition: A | Discharge status: Home / Self Care | Payer: Medicaid Other | Attending: Emergency Medicine | Admitting: Emergency Medicine

## 2015-01-18 DIAGNOSIS — Z8719 Personal history of other diseases of the digestive system: Secondary | ICD-10-CM | POA: Insufficient documentation

## 2015-01-18 DIAGNOSIS — S93402A Sprain of unspecified ligament of left ankle, initial encounter: Secondary | ICD-10-CM | POA: Insufficient documentation

## 2015-01-18 DIAGNOSIS — Y92008 Other place in unspecified non-institutional (private) residence as the place of occurrence of the external cause: Secondary | ICD-10-CM | POA: Insufficient documentation

## 2015-01-18 DIAGNOSIS — Z862 Personal history of diseases of the blood and blood-forming organs and certain disorders involving the immune mechanism: Secondary | ICD-10-CM | POA: Insufficient documentation

## 2015-01-18 DIAGNOSIS — W010XXA Fall on same level from slipping, tripping and stumbling without subsequent striking against object, initial encounter: Secondary | ICD-10-CM | POA: Insufficient documentation

## 2015-01-18 DIAGNOSIS — F17200 Nicotine dependence, unspecified, uncomplicated: Secondary | ICD-10-CM | POA: Insufficient documentation

## 2015-01-18 DIAGNOSIS — Y9389 Activity, other specified: Secondary | ICD-10-CM | POA: Insufficient documentation

## 2015-01-18 DIAGNOSIS — Y998 Other external cause status: Secondary | ICD-10-CM | POA: Insufficient documentation

## 2015-01-18 MED ORDER — IBUPROFEN 600 MG PO TABS: 600.0000 mg | ORAL_TABLET | Freq: Four times a day (QID) | ORAL | Status: DC | PRN

## 2015-01-18 NOTE — ED Provider Notes (Signed)
CSN: [id number]     Arrival date & time 01/18/15  2016 History  By signing my name below, I, Soijett Blue, attest that this documentation has been prepared under the direction and in the presence of Antonietta Breach, PA-C Electronically Signed: Soijett Blue, ED Scribe. 01/18/2015. 8:46 PM.   Chief Complaint  Patient presents with  . Ankle Pain    The history is provided by the patient. No language interpreter was used.    Tiffany Sullivan is a 24 y.o. female who presents to the Emergency Department complaining of moderate left ankle pain onset last night. She notes that she fell when she stepped off a porch that was unstable and rolled her left ankle. She thinks that she sprained her left ankle. Pt is having associated symptoms of moderate left ankle swelling. She notes that she has tried ice and ibuprofen without compression with no relief of her symptoms. She denies color change, wound, rash, gait problem, numbness, tingling, weakness, and any other symptoms.    Past Medical History  Diagnosis Date  . Syncope and collapse   . Allergy   . Anemia   . GERD (gastroesophageal reflux disease)    History reviewed. No pertinent past surgical history. History reviewed. No pertinent family history. Social History  Substance Use Topics  . Smoking status: Current Every Day Smoker  . Smokeless tobacco: None  . Alcohol Use: Yes     Comment: occasional (every other weekend)   OB History    Gravida Para Term Preterm AB TAB SAB Ectopic Multiple Living   0 0              Review of Systems  Musculoskeletal: Positive for joint swelling and arthralgias. Negative for gait problem.  Skin: Negative for color change, rash and wound.  Neurological: Negative for weakness and numbness.       No tingling  All other systems reviewed and are negative.   Allergies  Review of patient's allergies indicates no known allergies.  Home Medications   Prior to Admission medications   Medication Sig Start Date  End Date Taking? Authorizing Provider  HYDROcodone-acetaminophen (NORCO/VICODIN) 5-325 MG tablet Take 1 tablet by mouth every 6 (six) hours as needed for moderate pain or severe pain.   Yes Historical Provider, MD  ibuprofen (ADVIL,MOTRIN) 600 MG tablet Take 1 tablet (600 mg total) by mouth every 6 (six) hours as needed. 01/18/15   Antonietta Breach, PA-C  naproxen (NAPROSYN) 500 MG tablet Take 1 tablet (500 mg total) by mouth 2 (two) times daily with a meal. Patient not taking: Reported on 01/18/2015 11/28/13   Gwynne Edinger, MD   BP 124/82 mmHg  Pulse 72  Temp(Src) 98.1 F (36.7 C) (Oral)  Resp 18  Ht 5\' 7"  (1.702 m)  Wt 99.791 kg  BMI 34.45 kg/m2  SpO2 99%  LMP 01/08/2015   Physical Exam  Constitutional: She appears well-developed and well-nourished. No distress.  Nontoxic/nonseptic appearing  HENT:  Head: Normocephalic and atraumatic.  Eyes: Conjunctivae are normal. No scleral icterus.  Cardiovascular: Normal rate, regular rhythm and intact distal pulses.   DP and PT pulses 2+ in the left lower extremity  Musculoskeletal: She exhibits tenderness.       Left ankle: She exhibits swelling (mild). She exhibits normal range of motion, no deformity, no laceration and normal pulse. Tenderness. Lateral malleolus tenderness found. Achilles tendon normal.       Feet:  Neurological: She is alert. She exhibits normal muscle tone. Coordination normal.  No numbness, tingling or weakness of the affected extremity. Patient able to wiggle all toes.  Skin: Skin is warm and dry. She is not diaphoretic.  Nursing note and vitals reviewed.   ED Course  Procedures (including critical care time) DIAGNOSTIC STUDIES: Oxygen Saturation is 99% on RA, nl by my interpretation.    COORDINATION OF CARE: 8:46 PM Discussed treatment plan with pt at bedside which includes ankle brace, RICE, referral to sports medicine PRN and pt agreed to plan.   Labs Review Labs Reviewed - No data to display  Imaging  Review No results found.    EKG Interpretation None      MDM   Final diagnoses:  Ankle sprain, left, initial encounter    24 year old female presents to the emergency department with symptoms consistent with ankle sprain. Patient is neurovascularly intact. No bony deformity or crepitus. There is some swelling to the lateral malleolus which is mild. Patient ambulatory in the emergency department with steady gait. Low suspicion for fracture, therefore will forego xray. Patient given ASO ankle brace. Have advised icing, elevation, and ibuprofen for symptom control. Will refer to sports medicine if pain persists. Return precautions given at discharge. Patient agreeable to plan with no unaddressed concerns. Patient discharged in good condition.  I personally performed the services described in this documentation, which was scribed in my presence. The recorded information has been reviewed and is accurate.    Filed Vitals:   01/18/15 2025  BP: 124/82  Pulse: 72  Temp: 98.1 F (36.7 C)  TempSrc: Oral  Resp: 18  Height: 5\' 7"  (1.702 m)  Weight: 99.791 kg  SpO2: 99%      Antonietta Breach, PA-C 01/18/15 2115  Forde Dandy, MD 01/19/15 [phone]

## 2015-01-18 NOTE — ED Notes (Signed)
Left ankle pain post fall at home. Happened last night.

## 2015-01-18 NOTE — Discharge Instructions (Signed)
Rest and elevate your ankle/left leg as much as possible. Apply ice 3-4 times per day. Take ibuprofen as prescribed. Wear a brace during the day at all times for the next few weeks. Follow up with a sports medicine doctor as needed.  Ankle Sprain An ankle sprain is an injury to the strong, fibrous tissues (ligaments) that hold the bones of your ankle joint together.  CAUSES An ankle sprain is usually caused by a fall or by twisting your ankle. Ankle sprains most commonly occur when you step on the outer edge of your foot, and your ankle turns inward. People who participate in sports are more prone to these types of injuries.  SYMPTOMS   Pain in your ankle. The pain may be present at rest or only when you are trying to stand or walk.  Swelling.  Bruising. Bruising may develop immediately or within 1 to 2 days after your injury.  Difficulty standing or walking, particularly when turning corners or changing directions. DIAGNOSIS  Your caregiver will ask you details about your injury and perform a physical exam of your ankle to determine if you have an ankle sprain. During the physical exam, your caregiver will press on and apply pressure to specific areas of your foot and ankle. Your caregiver will try to move your ankle in certain ways. An X-ray exam may be done to be sure a bone was not broken or a ligament did not separate from one of the bones in your ankle (avulsion fracture).  TREATMENT  Certain types of braces can help stabilize your ankle. Your caregiver can make a recommendation for this. Your caregiver may recommend the use of medicine for pain. If your sprain is severe, your caregiver may refer you to a surgeon who helps to restore function to parts of your skeletal system (orthopedist) or a physical therapist. [place] ice to your injury for 1-2 days or as directed by your caregiver. Applying ice helps to reduce inflammation and pain.  Put ice in a plastic  bag.  Place a towel between your skin and the bag.  Leave the ice on for 15-20 minutes at a time, every 2 hours while you are awake.  Only take over-the-counter or prescription medicines for pain, discomfort, or fever as directed by your caregiver.  Elevate your injured ankle above the level of your heart as much as possible for 2-3 days.  If your caregiver recommends crutches, use them as instructed. Gradually put weight on the affected ankle. Continue to use crutches or a cane until you can walk without feeling pain in your ankle.  If you have a plaster splint, wear the splint as directed by your caregiver. Do not rest it on anything harder than a pillow for the first 24 hours. Do not put weight on it. Do not get it wet. You may take it off to take a shower or bath.  You may have been given an elastic bandage to wear around your ankle to provide support. If the elastic bandage is too tight (you have numbness or tingling in your foot or your foot becomes cold and blue), adjust the bandage to make it comfortable.  If you have an air splint, you may blow more air into it or let air out to make it more comfortable. You may take your splint off at night and before taking a shower or bath. Wiggle your toes in the splint several times per day to decrease swelling. [place]  CARE IF:   You have rapidly increasing bruising or swelling.  Your toes feel extremely cold or you lose feeling in your foot.  Your pain is not relieved with medicine. SEEK IMMEDIATE MEDICAL CARE IF:  Your toes are numb or blue.  You have severe pain that is increasing. MAKE SURE YOU:   Understand these instructions.  Will watch your condition.  Will get help right away if you are not doing well or get worse.   This information is not intended to replace advice given to you by your health care provider. Make sure you discuss any questions you have with your health care provider.   Document Released: 02/09/2005  Document Revised: 03/02/2014 Document Reviewed: 02/21/2011 Elsevier Interactive Patient Education [employer].

## 2015-01-18 NOTE — ED Notes (Signed)
AVS explained in detail. Knows to use ASO brace for support and to take Ibuprofen for pain. Encouraged to ice/elevate. No other c/c. Given follow up for [facility] sports medicine MD.

## 2015-02-27 ENCOUNTER — Ambulatory Visit: Payer: Self-pay | Admitting: Obstetrics

## 2015-03-13 ENCOUNTER — Encounter: Payer: Self-pay | Admitting: Certified Nurse Midwife

## 2015-03-13 ENCOUNTER — Ambulatory Visit (INDEPENDENT_AMBULATORY_CARE_PROVIDER_SITE_OTHER): Payer: BLUE CROSS/BLUE SHIELD | Admitting: Certified Nurse Midwife

## 2015-03-13 ENCOUNTER — Telehealth: Payer: Self-pay

## 2015-03-13 VITALS — BP 126/80 | HR 98 | Temp 97.9°F | Ht 67.5 in | Wt 234.0 lb

## 2015-03-13 DIAGNOSIS — Z01419 Encounter for gynecological examination (general) (routine) without abnormal findings: Secondary | ICD-10-CM | POA: Diagnosis not present

## 2015-03-13 DIAGNOSIS — E669 Obesity, unspecified: Secondary | ICD-10-CM

## 2015-03-13 DIAGNOSIS — Z3202 Encounter for pregnancy test, result negative: Secondary | ICD-10-CM | POA: Diagnosis not present

## 2015-03-13 DIAGNOSIS — N63 Unspecified lump in unspecified breast: Secondary | ICD-10-CM

## 2015-03-13 DIAGNOSIS — L732 Hidradenitis suppurativa: Secondary | ICD-10-CM

## 2015-03-13 DIAGNOSIS — Z113 Encounter for screening for infections with a predominantly sexual mode of transmission: Secondary | ICD-10-CM

## 2015-03-13 LAB — POCT URINE PREGNANCY: Preg Test, Ur: NEGATIVE

## 2015-03-13 NOTE — Progress Notes (Signed)
Patient ID: Tiffany Sullivan, female   DOB: 01/09/1991, 25 y.o.   MRN: [id number]   Subjective:      Tiffany Sullivan is a 25 y.o. female here for a routine exam.  Current complaints: none.  Currently sexually active.  Using condoms.  Employed, full time [employer].   Right mid abdominal pain: has not tried anything for it, states it is a constant ache,  Has been their for several months, states she has regular soft bowel movements, denies any radiaiton, nothing makes it better. States she is stressed and not regularly exercising.  Has gained weight since moving from [place], moved about 2 years ago.  Hx of irregular periods recently, menses lasting 5 days, denies menorrhagia, clots, or cramping.   Does desire to have a breast reduction.   Patient desires full STD screening.  Has had hx of Hidradenitis suppurativa (HS).     Personal health questionnaire:  Is patient Ashkenazi Jewish, have a family history of breast and/or ovarian cancer: yes, maternal aunt around 56 years of age, PGM Is there a family history of uterine cancer diagnosed at age < 60, gastrointestinal cancer, urinary tract cancer, family member who is a Field seismologist syndrome-associated carrier: ? unknown Is the patient overweight and hypertensive, family history of diabetes, personal history of gestational diabetes, preeclampsia or PCOS: yes Is patient over 32, have PCOS,  family history of premature CHD under age 32, diabetes, smoke, have hypertension or peripheral artery disease:  Yes, PGF deceased CVA, MGM: deceased stomach CA? At any time, has a partner hit, kicked or otherwise hurt or frightened you?: no Over the past 2 weeks, have you felt down, depressed or hopeless?: no Over the past 2 weeks, have you felt little interest or pleasure in doing things?:no   Gynecologic History Patient's last menstrual period was 02/24/2015. Contraception: condoms Last Pap: unknown. Results were: normal according to the patient Last mammogram: N/A.    Obstetric History OB History  Gravida Para Term Preterm AB SAB TAB Ectopic Multiple Living  0 0                Past Medical History  Diagnosis Date  . Syncope and collapse   . Allergy   . Anemia   . GERD (gastroesophageal reflux disease)     No past surgical history on file.  No current outpatient prescriptions on file. No Known Allergies  Social History  Substance Use Topics  . Smoking status: Never Smoker   . Smokeless tobacco: Never Used  . Alcohol Use: 0.0 oz/week    0 Standard drinks or equivalent per week     Comment: occasional (every other weekend)    History reviewed. No pertinent family history.    Review of Systems  Constitutional: negative for fatigue and weight loss Respiratory: negative for cough and wheezing Cardiovascular: negative for chest pain, fatigue and palpitations Gastrointestinal: negative for abdominal pain and change in bowel habits Musculoskeletal:negative for myalgias Neurological: negative for gait problems and tremors Behavioral/Psych: negative for abusive relationship, depression Endocrine: negative for temperature intolerance   Genitourinary:negative for abnormal menstrual periods, genital lesions, hot flashes, sexual problems and vaginal discharge Integument/breast: negative for breast lump, breast tenderness, nipple discharge and skin lesion(s)    Objective:       BP 126/80 mmHg  Pulse 98  Temp(Src) 97.9 F (36.6 C)  Ht 5' 7.5" (1.715 m)  Wt 234 lb (106.142 kg)  BMI 36.09 kg/m2  LMP 02/24/2015 General:   alert  Skin:   no  rash or abnormalities  Lungs:   clear to auscultation bilaterally  Heart:   regular rate and rhythm, S1, S2 normal, no murmur, click, rub or gallop  Breasts:   2 suspicious masses on left breast 11 o'clock sternal position on right breast 4 oclock postion, no skin or nipple changes or axillary nodes  Abdomen:  normal findings: no organomegaly, soft, non-tender and no hernia  Lower extremities  2  healing HS lesions, 1 lesion on right upper leg erythema, has head forming.    Pelvis:  External genitalia: normal general appearance Urinary system: urethral meatus normal and bladder without fullness, nontender Vaginal: normal without tenderness, induration or masses Cervix: normal appearance Adnexa: normal bimanual exam Uterus: anteverted and non-tender, normal size   Lab Review Urine pregnancy test Labs reviewed yes Radiologic studies reviewed no  50% of 30 min visit spent on counseling and coordination of care.   Assessment:    Healthy female exam.   obesity  Irregular periods  breast masses STD screening exam Hidradenitis suppurativa (HS)  Plan:    Education reviewed: calcium supplements, depression evaluation, low fat, low cholesterol diet, safe sex/STD prevention, self breast exams, skin cancer screening and weight bearing exercise. Contraception: condoms. Follow up in: 1 year.   No orders of the defined types were placed in this encounter.   Orders Placed This Encounter  Procedures  . SureSwab, Vaginosis/Vaginitis Plus  . US BREAST COMPLETE UNI LEFT INC AXILLA    Standing Status: Future     Number of Occurrences:      Standing Expiration Date: 05/10/2016    Order Specific Question:  Reason for Exam (SYMPTOM  OR DIAGNOSIS REQUIRED)    Answer:  left breast 11 o'clock sternal position on right breast 4 oclock postion    Order Specific Question:  Preferred imaging location?    Answer:  [facility]  . US BREAST COMPLETE UNI RIGHT INC AXILLA    Standing Status: Future     Number of Occurrences:      Standing Expiration Date: 05/10/2016    Order Specific Question:  Reason for Exam (SYMPTOM  OR DIAGNOSIS REQUIRED)    Answer:  left breast 11 o'clock sternal position on right breast 4 oclock postion    Order Specific Question:  Preferred imaging location?    Answer:  [facility]  . HIV antibody (with reflex)  . Hepatitis B surface antigen  . RPR  .  Hepatitis C antibody  . Ambulatory referral to Dermatology    Referral Priority:  Routine    Referral Type:  Consultation    Referral Reason:  Specialty Services Required    Requested Specialty:  Dermatology    Number of Visits Requested:  1  . POCT urine pregnancy   Need to obtain previous records Possible management options include: contraception, obesity treatment

## 2015-03-13 NOTE — Telephone Encounter (Signed)
Left messages on both phones that patient has appt at [facility] at 10:30am on Monday, 04/01/15 - with Dr. Lavonna Monarch, MD

## 2015-03-14 ENCOUNTER — Telehealth: Payer: Self-pay

## 2015-03-14 LAB — HEPATITIS B SURFACE ANTIGEN: Hepatitis B Surface Ag: NEGATIVE

## 2015-03-14 LAB — PAP IG W/ RFLX HPV ASCU

## 2015-03-14 LAB — HIV ANTIBODY (ROUTINE TESTING W REFLEX): HIV 1&2 Ab, 4th Generation: NONREACTIVE

## 2015-03-14 LAB — HEPATITIS C ANTIBODY: HCV Ab: NEGATIVE

## 2015-03-14 LAB — RPR

## 2015-03-14 NOTE — Telephone Encounter (Signed)
Let patient know of her appts with [facility] and [facility] - she stated may have to call and change them due to work schedule, gave her both phone numbers

## 2015-03-17 LAB — SURESWAB, VAGINOSIS/VAGINITIS PLUS
ATOPOBIUM VAGINAE: NOT DETECTED Log (cells/mL)
C. PARAPSILOSIS, DNA: NOT DETECTED
C. TROPICALIS, DNA: NOT DETECTED
C. albicans, DNA: NOT DETECTED
C. glabrata, DNA: NOT DETECTED
C. trachomatis RNA, TMA: NOT DETECTED
Gardnerella vaginalis: NOT DETECTED Log (cells/mL)
LACTOBACILLUS SPECIES: 6.8 Log (cells/mL)
MEGASPHAERA SPECIES: NOT DETECTED Log (cells/mL)
N. GONORRHOEAE RNA, TMA: NOT DETECTED
T. vaginalis RNA, QL TMA: NOT DETECTED

## 2015-03-19 ENCOUNTER — Inpatient Hospital Stay: Admission: RE | Admit: 2015-03-19 | Payer: Medicaid Other | Source: Ambulatory Visit

## 2015-03-25 ENCOUNTER — Telehealth: Payer: Self-pay | Admitting: *Deleted

## 2015-03-27 ENCOUNTER — Ambulatory Visit: Payer: Self-pay | Admitting: Obstetrics

## 2015-04-30 ENCOUNTER — Other Ambulatory Visit: Payer: Medicaid Other

## 2015-05-10 NOTE — Telephone Encounter (Signed)
Patient called.

## 2015-09-10 ENCOUNTER — Emergency Department (HOSPITAL_COMMUNITY)
Admission: EM | Admit: 2015-09-10 | Discharge: 2015-09-10 | Disposition: A | Discharge status: Home / Self Care | Payer: Self-pay | Attending: Emergency Medicine | Admitting: Emergency Medicine

## 2015-09-10 ENCOUNTER — Encounter (HOSPITAL_COMMUNITY): Payer: Self-pay | Admitting: Nurse Practitioner

## 2015-09-10 ENCOUNTER — Emergency Department (HOSPITAL_COMMUNITY): Payer: Self-pay

## 2015-09-10 DIAGNOSIS — D259 Leiomyoma of uterus, unspecified: Secondary | ICD-10-CM | POA: Insufficient documentation

## 2015-09-10 DIAGNOSIS — R52 Pain, unspecified: Secondary | ICD-10-CM

## 2015-09-10 DIAGNOSIS — M545 Low back pain: Secondary | ICD-10-CM

## 2015-09-10 LAB — CBC WITH DIFFERENTIAL/PLATELET
Basophils Absolute: 0 10*3/uL (ref 0.0–0.1)
Basophils Relative: 0 %
Eosinophils Absolute: 0.1 10*3/uL (ref 0.0–0.7)
Eosinophils Relative: 2 %
HCT: 42.8 % (ref 36.0–46.0)
HEMOGLOBIN: 13.4 g/dL (ref 12.0–15.0)
Lymphocytes Relative: 21 %
Lymphs Abs: 1.9 10*3/uL (ref 0.7–4.0)
MCH: 24.6 pg — AB (ref 26.0–34.0)
MCHC: 31.3 g/dL (ref 30.0–36.0)
MCV: 78.7 fL (ref 78.0–100.0)
Monocytes Absolute: 0.3 10*3/uL (ref 0.1–1.0)
Monocytes Relative: 4 %
Neutro Abs: 6.8 10*3/uL (ref 1.7–7.7)
Neutrophils Relative %: 73 %
Platelets: 326 10*3/uL (ref 150–400)
RBC: 5.44 MIL/uL — ABNORMAL HIGH (ref 3.87–5.11)
RDW: 14.8 % (ref 11.5–15.5)
WBC: 9.1 10*3/uL (ref 4.0–10.5)

## 2015-09-10 LAB — URINE MICROSCOPIC-ADD ON: WBC UA: NONE SEEN WBC/hpf (ref 0–5)

## 2015-09-10 LAB — URINALYSIS, ROUTINE W REFLEX MICROSCOPIC
BILIRUBIN URINE: NEGATIVE
Glucose, UA: NEGATIVE mg/dL
Ketones, ur: NEGATIVE mg/dL
Leukocytes, UA: NEGATIVE
NITRITE: NEGATIVE
PH: 6 (ref 5.0–8.0)
Protein, ur: NEGATIVE mg/dL
SPECIFIC GRAVITY, URINE: 1.014 (ref 1.005–1.030)

## 2015-09-10 LAB — BASIC METABOLIC PANEL
Anion gap: 9 (ref 5–15)
BUN: 7 mg/dL (ref 6–20)
CO2: 25 mmol/L (ref 22–32)
CREATININE: 0.85 mg/dL (ref 0.44–1.00)
Calcium: 10.1 mg/dL (ref 8.9–10.3)
Chloride: 105 mmol/L (ref 101–111)
GFR calc Af Amer: >60 mL/min (ref >60)
GFR calc non Af Amer: >60 mL/min (ref >60)
Glucose, Bld: 117 mg/dL — ABNORMAL HIGH (ref 65–99)
POTASSIUM: 3.9 mmol/L (ref 3.5–5.1)
Sodium: 139 mmol/L (ref 135–145)

## 2015-09-10 LAB — PREGNANCY, URINE: Preg Test, Ur: NEGATIVE

## 2015-09-10 MED ORDER — HYDROCODONE-ACETAMINOPHEN 5-325 MG PO TABS: 1.0000 | ORAL_TABLET | Freq: Four times a day (QID) | ORAL | Status: DC | PRN

## 2015-09-10 MED ORDER — CYCLOBENZAPRINE HCL 5 MG PO TABS
5.0000 mg | ORAL_TABLET | Freq: Two times a day (BID) | ORAL | Status: DC | PRN
Start: 2015-09-10 — End: 2016-06-17

## 2015-09-10 MED ORDER — NAPROXEN 500 MG PO TABS: 500.0000 mg | ORAL_TABLET | Freq: Two times a day (BID) | ORAL | Status: DC

## 2015-09-10 NOTE — ED Notes (Signed)
Returned from CT.

## 2015-09-10 NOTE — ED Notes (Signed)
Patient transported to CT 

## 2015-09-10 NOTE — ED Provider Notes (Signed)
THIS IS A SHARED VISIT WITH WILL DANSIE, PAC  Tiffany Sullivan is a 25 y.o. female with right side pain that has been to CT and then ultrasound.   BP 123/84 mmHg  Pulse 83  Temp(Src) 98.3 F (36.8 C) (Oral)  Resp 18  Ht 5' 7.5" (1.715 m)  Wt 99.791 kg  BMI 33.93 kg/m2  SpO2 100%  LMP 09/02/2015  US Transvaginal Non-ob  09/10/2015  CLINICAL DATA:  Right-sided flank, abdominal and pelvic pain. Possible uterine fibroid on CT. EXAM: TRANSABDOMINAL AND TRANSVAGINAL ULTRASOUND OF PELVIS TECHNIQUE: Both transabdominal and transvaginal ultrasound examinations of the pelvis were performed. Transabdominal technique was performed for global imaging of the pelvis including uterus, ovaries, adnexal regions, and pelvic cul-de-sac. It was necessary to proceed with endovaginal exam following the transabdominal exam to visualize the uterus, ovaries and adnexa. COMPARISON:  Noncontrast CT abdomen/ pelvis earlier this day FINDINGS: Uterus Measurements: 7.9 x 3.4 x 4.7 cm. There is an exophytic pedunculated fibroid from the right aspect of the uterine fundus measuring 3.6 x 3.7 x 3.1 cm. Nabothian cyst in the cervix. Endometrium Thickness: 9.1 mm.  No focal abnormality visualized. Right ovary Measurements: 3.5 x 2.2 x 1.6 cm. Normal appearance/no adnexal mass. There are physiologic follicles. Blood flow is seen. Left ovary Measurements: 4.1 x 3.0 x 2.5 cm. Normal appearance/no adnexal mass. There are physiologic follicles with a follicular cyst measuring 1.5 cm. Blood flow is seen. Other findings No abnormal free fluid. IMPRESSION: Exophytic 3.7 cm fibroid from the right uterine fundus corresponding to abnormality on CT. Normal sonographic appearance of the ovaries. Electronically Signed   By: Jeb Levering M.D.   On: 09/10/2015 17:32   US Pelvis Complete  09/10/2015  CLINICAL DATA:  Right-sided flank, abdominal and pelvic pain. Possible uterine fibroid on CT. EXAM: TRANSABDOMINAL AND TRANSVAGINAL ULTRASOUND OF PELVIS  TECHNIQUE: Both transabdominal and transvaginal ultrasound examinations of the pelvis were performed. Transabdominal technique was performed for global imaging of the pelvis including uterus, ovaries, adnexal regions, and pelvic cul-de-sac. It was necessary to proceed with endovaginal exam following the transabdominal exam to visualize the uterus, ovaries and adnexa. COMPARISON:  Noncontrast CT abdomen/ pelvis earlier this day FINDINGS: Uterus Measurements: 7.9 x 3.4 x 4.7 cm. There is an exophytic pedunculated fibroid from the right aspect of the uterine fundus measuring 3.6 x 3.7 x 3.1 cm. Nabothian cyst in the cervix. Endometrium Thickness: 9.1 mm.  No focal abnormality visualized. Right ovary Measurements: 3.5 x 2.2 x 1.6 cm. Normal appearance/no adnexal mass. There are physiologic follicles. Blood flow is seen. Left ovary Measurements: 4.1 x 3.0 x 2.5 cm. Normal appearance/no adnexal mass. There are physiologic follicles with a follicular cyst measuring 1.5 cm. Blood flow is seen. Other findings No abnormal free fluid. IMPRESSION: Exophytic 3.7 cm fibroid from the right uterine fundus corresponding to abnormality on CT. Normal sonographic appearance of the ovaries. Electronically Signed   By: Jeb Levering M.D.   On: 09/10/2015 17:32   Ct Renal Stone Study  09/10/2015  CLINICAL DATA:  Right flank pain. EXAM: CT ABDOMEN AND PELVIS WITHOUT CONTRAST TECHNIQUE: Multidetector CT imaging of the abdomen and pelvis was performed following the standard protocol without IV contrast. COMPARISON:  None. FINDINGS: Visualized lung bases are unremarkable. No significant osseous abnormality is noted. No gallstones are noted. No focal abnormality is noted in the liver, spleen or pancreas on these unenhanced images. Adrenal glands are unremarkable. Left kidney and ureter appear normal. 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 No  renal or ureteral calculi are noted. The appendix appears normal. There is no evidence of bowel obstruction. No abnormal fluid collection is noted. Urinary bladder is decompressed. Ovaries appear normal. 4 cm rounded soft tissue mass is noted anteriorly in the pelvis above the urinary bladder. This may represent exophytic fibroid, but pelvic ultrasound is recommended for further evaluation and to rule out other pathology. 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 No hydronephrosis or renal obstruction is noted. No renal or ureteral calculi are noted. 4 cm rounded mass is noted anteriorly in the pelvis just above the urinary bladder. Potentially this may represent exophytic fibroid, and pelvic ultrasound is recommended for further evaluation. If this is demonstrated not to represent a fibroid, then CT scan of the pelvis with intravenous contrast is recommended for further evaluation. Electronically Signed   By: Marijo Conception, M.D.   On: 09/10/2015 15:14    Her findings on ultrasound are consistent with uterine fibroid. The CT is negative for stone. Patient concerned about the trace of blood in her urine.  She is just finishing her period and I discussed that the blood may have been from the vagina rather than the urine. I suggested that she follow up with her GYN for both the blood and the fibroid. Patient states she really does not want to go home without knowing why she has the blood. I offered to do a cath urine to be sure if there is blood in the urine. Patient declined and states she is just ready to go home.   Re evaluated and patient has pain to her right upper side. She reports that sometimes at night it feels like a knot in the area and hurts worse.  I discussed with the patient muscle pain and spasm and will give Flexeril.  Stable for d/c without kidney stone noted on CT, no torsion  or ovarian cyst or ectopic noted on ultrasound and patient does not appear toxic.    Discussed with the patient ultrasound findings and plan of care and all questioned fully answered. She will follow up with her GYN for the fibroid and the hematuria or return if any problems arise. Discussed signs and symptoms that wound indicate need for return.   [place], NP 09/10/15 1811  Dorie Rank, MD 09/11/15 [phone]

## 2015-09-10 NOTE — ED Notes (Signed)
Pt decided  to go on with workup. Picked up for CT.

## 2015-09-10 NOTE — ED Notes (Signed)
Patient in US.

## 2015-09-10 NOTE — Discharge Instructions (Signed)
Back Pain, Adult °Back pain is very common in adults. The cause of back pain is rarely dangerous and the pain often gets better over time. The cause of your back pain may not be known. Some common causes of back pain include: °· Strain of the muscles or ligaments supporting the spine. °· Wear and tear (degeneration) of the spinal disks. °· Arthritis. °· Direct injury to the back. °For many people, back pain may return. Since back pain is rarely dangerous, most people can learn to manage this condition on their own. °HOME CARE INSTRUCTIONS °Watch your back pain for any changes. The following actions may help to lessen any discomfort you are feeling: °· Remain active. It is stressful on your back to sit or stand in one place for long periods of time. Do not sit, drive, or stand in one place for more than 30 minutes at a time. Take short walks on even surfaces as soon as you are able. Try to increase the length of time you walk each day. °· Exercise regularly as directed by your health care provider. Exercise helps your back heal faster. It also helps avoid future injury by keeping your muscles strong and flexible. °· Do not stay in bed. Resting more than 1-2 days can delay your recovery. °· Pay attention to your body when you bend and lift. The most comfortable positions are those that put less stress on your recovering back. Always use proper lifting techniques, including: °¨ Bending your knees. °¨ Keeping the load close to your body. °¨ Avoiding twisting. °· Find a comfortable position to sleep. Use a firm mattress and lie on your side with your knees slightly bent. If you lie on your back, put a pillow under your knees. °· Avoid feeling anxious or stressed. Stress increases muscle tension and can worsen back pain. It is important to recognize when you are anxious or stressed and learn ways to manage it, such as with exercise. °· Take medicines only as directed by your health care provider. Over-the-counter  medicines to reduce pain and inflammation are often the most helpful. Your health care provider may prescribe muscle relaxant drugs. These medicines help dull your pain so you can more quickly return to your normal activities and healthy exercise. °· Apply ice to the injured area: °¨ Put ice in a plastic bag. °¨ Place a towel between your skin and the bag. °¨ Leave the ice on for 20 minutes, 2-3 times a day for the first 2-3 days. After that, ice and heat may be alternated to reduce pain and spasms. °· Maintain a healthy weight. Excess weight puts extra stress on your back and makes it difficult to maintain good posture. °SEEK MEDICAL CARE IF: °· You have pain that is not relieved with rest or medicine. °· You have increasing pain going down into the legs or buttocks. °· You have pain that does not improve in one week. °· You have night pain. °· You lose weight. °· You have a fever or chills. °SEEK IMMEDIATE MEDICAL CARE IF:  °· You develop new bowel or bladder control problems. °· You have unusual weakness or numbness in your arms or legs. °· You develop nausea or vomiting. °· You develop abdominal pain. °· You feel faint. °  °This information is not intended to replace advice given to you by your health care provider. Make sure you discuss any questions you have with your health care provider. °  °Document Released: 02/09/2005 Document Revised: 03/02/2014 Document Reviewed: 06/13/2013 °Elsevier Interactive Patient Education ©2016 Elsevier   Inc[employer]  Back Exercises The following exercises strengthen the muscles that help to support the back. They also help to keep the lower back flexible. Doing these exercises can help to prevent back pain or lessen existing pain. If you have back pain or discomfort, try doing these exercises 2-3 times each day or as told by your health care provider. When the pain goes away, do them once each day, but increase the number of times that you repeat the steps for each exercise (do more  repetitions). If you do not have back pain or discomfort, do these exercises once each day or as told by your health care provider. EXERCISES Single Knee to Chest Repeat these steps 3-5 times for each leg:  Lie on your back on a firm bed or the floor with your legs extended.  Bring one knee to your chest. Your other leg should stay extended and in contact with the floor.  Hold your knee in place by grabbing your knee or thigh.  Pull on your knee until you feel a gentle stretch in your lower back.  Hold the stretch for 10-30 seconds.  Slowly release and straighten your leg. Pelvic Tilt Repeat these steps 5-10 times:  Lie on your back on a firm bed or the floor with your legs extended.  Bend your knees so they are pointing toward the ceiling and your feet are flat on the floor.  Tighten your lower abdominal muscles to press your lower back against the floor. This motion will tilt your pelvis so your tailbone points up toward the ceiling instead of pointing to your feet or the floor.  With gentle tension and even breathing, hold this position for 5-10 seconds. Cat-Cow Repeat these steps until your lower back becomes more flexible:  Get into a hands-and-knees position on a firm surface. Keep your hands under your shoulders, and keep your knees under your hips. You may place padding under your knees for comfort.  Let your head hang down, and point your tailbone toward the floor so your lower back becomes rounded like the back of a cat.  Hold this position for 5 seconds.  Slowly lift your head and point your tailbone up toward the ceiling so your back forms a sagging arch like the back of a cow.  Hold this position for 5 seconds. Press-Ups Repeat these steps 5-10 times:  Lie on your abdomen (face-down) on the floor.  Place your palms near your head, about shoulder-width apart.  While you keep your back as relaxed as possible and keep your hips on the floor, slowly straighten  your arms to raise the top half of your body and lift your shoulders. Do not use your back muscles to raise your upper torso. You may adjust the placement of your hands to make yourself more comfortable.  Hold this position for 5 seconds while you keep your back relaxed.  Slowly return to lying flat on the floor. Bridges Repeat these steps 10 times:  Lie on your back on a firm surface.  Bend your knees so they are pointing toward the ceiling and your feet are flat on the floor.  Tighten your buttocks muscles and lift your buttocks off of the floor until your waist is at almost the same height as your knees. You should feel the muscles working in your buttocks and the back of your thighs. If you do not feel these muscles, slide your feet 1-2 inches farther away from your buttocks.  Hold this  position for 3-5 seconds.  Slowly lower your hips to the starting position, and allow your buttocks muscles to relax completely. If this exercise is too easy, try doing it with your arms crossed over your chest. Abdominal Crunches Repeat these steps 5-10 times: 1. Lie on your back on a firm bed or the floor with your legs extended. 2. Bend your knees so they are pointing toward the ceiling and your feet are flat on the floor. 3. Cross your arms over your chest. 4. Tip your chin slightly toward your chest without bending your neck. 5. Tighten your abdominal muscles and slowly raise your trunk (torso) high enough to lift your shoulder blades a tiny bit off of the floor. Avoid raising your torso higher than that, because it can put too much stress on your low back and it does not help to strengthen your abdominal muscles. 6. Slowly return to your starting position. Back Lifts Repeat these steps 5-10 times: 1. Lie on your abdomen (face-down) with your arms at your sides, and rest your forehead on the floor. 2. Tighten the muscles in your legs and your buttocks. 3. Slowly lift your chest off of the floor  while you keep your hips pressed to the floor. Keep the back of your head in line with the curve in your back. Your eyes should be looking at the floor. 4. Hold this position for 3-5 seconds. 5. Slowly return to your starting position. SEEK MEDICAL CARE IF:  Your back pain or discomfort gets much worse when you do an exercise.  Your back pain or discomfort does not lessen within 2 hours after you exercise. If you have any of these problems, stop doing these exercises right away. Do not do them again unless your health care provider says that you can. SEEK IMMEDIATE MEDICAL CARE IF:  You develop sudden, severe back pain. If this happens, stop doing the exercises right away. Do not do them again unless your health care provider says that you can.   This information is not intended to replace advice given to you by your health care provider. Make sure you discuss any questions you have with your health care provider.   Document Released: 03/19/2004 Document Revised: 10/31/2014 Document Reviewed: 04/05/2014 Elsevier Interactive Patient Education 2016 Elsevier Inc. Uterine Fibroids Uterine fibroids are tissue masses (tumors) that can develop in the womb (uterus). They are also called leiomyomas. This type of tumor is not cancerous (benign) and does not spread to other parts of the body outside of the pelvic area, which is between the hip bones. Occasionally, fibroids may develop in the fallopian tubes, in the cervix, or on the support structures (ligaments) that surround the uterus. You can have one or many fibroids. Fibroids can vary in size, weight, and where they grow in the uterus. Some can become quite large. Most fibroids do not require medical treatment. CAUSES A fibroid can develop when a single uterine cell keeps growing (replicating). Most cells in the human body have a control mechanism that keeps them from replicating without control. SIGNS AND SYMPTOMS Symptoms may include:   Heavy  bleeding during your period.  Bleeding or spotting between periods.  Pelvic pain and pressure.  Bladder problems, such as needing to urinate more often (urinary frequency) or urgently.  Inability to reproduce offspring (infertility).  Miscarriages. DIAGNOSIS Uterine fibroids are diagnosed through a physical exam. Your health care provider may feel the lumpy tumors during a pelvic exam. Ultrasonography and an MRI may be  done to determine the size, location, and number of fibroids. TREATMENT Treatment may include:  Watchful waiting. This involves getting the fibroid checked by your health care provider to see if it grows or shrinks. Follow your health care provider's recommendations for how often to have this checked.  Hormone medicines. These can be taken by mouth or given through an intrauterine device (IUD).  Surgery.  Removing the fibroids (myomectomy) or the uterus (hysterectomy).  Removing blood supply to the fibroids (uterine artery embolization). If fibroids interfere with your fertility and you want to become pregnant, your health care provider may recommend having the fibroids removed.  HOME CARE INSTRUCTIONS  Keep all follow-up visits as directed by your health care provider. This is important.  Take medicines only as directed by your health care provider.  If you were prescribed a hormone treatment, take the hormone medicines exactly as directed.  Do not take aspirin, because it can cause bleeding.  Ask your health care provider about taking iron pills and increasing the amount of dark green, leafy vegetables in your diet. These actions can help to boost your blood iron levels, which may be affected by heavy menstrual bleeding.  Pay close attention to your period and tell your health care provider about any changes, such as:  Increased blood flow that requires you to use more pads or tampons than usual per month.  A change in the number of days that your period  lasts per month.  A change in symptoms that are associated with your period, such as abdominal cramping or back pain. SEEK MEDICAL CARE IF:  You have pelvic pain, back pain, or abdominal cramps that cannot be controlled with medicines.  You have an increase in bleeding between and during periods.  You soak tampons or pads in a half hour or less.  You feel lightheaded, extra tired, or weak. SEEK IMMEDIATE MEDICAL CARE IF:  You faint.  You have a sudden increase in pelvic pain.   This information is not intended to replace advice given to you by your health care provider. Make sure you discuss any questions you have with your health care provider.   Document Released: 02/07/2000 Document Revised: 03/02/2014 Document Reviewed: 08/08/2013 Elsevier Interactive Patient Education [employer].

## 2015-09-10 NOTE — ED Notes (Signed)
She c/o 2 week history lower back pain, radiating into her R side. She denies any n/v, bowel/bladder changes, abd or pelvic pain. She tried ibuprofen with some relief but pain returns. No noted activity at onset of pain. Pain is worse at night when lying flat. She is alert, breathing easily

## 2015-09-10 NOTE — ED Notes (Signed)
Patient given gown and asked to change for [place]

## 2015-09-10 NOTE — ED Notes (Signed)
Pt speaking with PA regarding whether or not she wants to have further workup.

## 2015-09-10 NOTE — ED Provider Notes (Signed)
CSN: [id number]     Arrival date & time 09/10/15  1259 History  By signing my name below, I, Tiffany Sullivan, attest that this documentation has been prepared under the direction and in the presence of non-physician practitioner, Waynetta Pean, PA-C. Electronically Signed: Evelene Sullivan, Scribe. 09/10/2015. 1:34 PM.   Chief Complaint  Patient presents with  . Back Pain   The history is provided by the patient. No language interpreter was used.    HPI Comments:  Tiffany Sullivan is a 25 y.o. female who presents to the Emergency Department complaining of gradually worsening, right sided back pain that radiates into her right side x 2 weeks. She notes her pain waxes and wanes in severity and is exacerbated by certain movements. She has taken ibuprofen with mild relief. No colic like pain. No history of kidney stones. No pain worsening with eating. No RUQ abdominal pain. She is unable to identify other alleviating or aggravating factors other than movement or touching. She denies nausea, vomiting, dysuria, hematuria, urinary urgency/frequency, abdominal pain, bowel/bladder incontinence, vaginal bleeding/discharge, and numbness/weakness in her extremities. Her LNMP was 1 week ago.   Past Medical History  Diagnosis Date  . Syncope and collapse   . Allergy   . Anemia   . GERD (gastroesophageal reflux disease)    History reviewed. No pertinent past surgical history. History reviewed. No pertinent family history. Social History  Substance Use Topics  . Smoking status: Never Smoker   . Smokeless tobacco: Never Used  . Alcohol Use: 0.0 oz/week    0 Standard drinks or equivalent per week     Comment: occasional (every other weekend)   OB History    Gravida Para Term Preterm AB TAB SAB Ectopic Multiple Living   0 0             Review of Systems  Constitutional: Negative for fever and chills.  HENT: Negative for congestion and sore throat.   Eyes: Negative for visual disturbance.  Respiratory:  Negative for cough, shortness of breath and wheezing.   Cardiovascular: Negative for chest pain and palpitations.  Gastrointestinal: Negative for nausea, vomiting, abdominal pain and diarrhea.  Genitourinary: Negative for dysuria, urgency, frequency, vaginal bleeding, vaginal discharge, difficulty urinating and pelvic pain.  Musculoskeletal: Positive for back pain. Negative for neck pain.  Skin: Negative for rash.  Neurological: Negative for weakness, numbness and headaches.  All other systems reviewed and are negative.  Allergies  Review of patient's allergies indicates no known allergies.  Home Medications   Prior to Admission medications   Medication Sig Start Date End Date Taking? Authorizing Provider  HYDROcodone-acetaminophen (NORCO/VICODIN) 5-325 MG tablet Take 1 tablet by mouth every 6 (six) hours as needed for moderate pain or severe pain. 09/10/15   Waynetta Pean, PA-C   BP 119/82 mmHg  Pulse 90  Temp(Src) 98.3 F (36.8 C) (Oral)  Resp 18  Ht 5' 7.5" (1.715 m)  Wt 99.791 kg  BMI 33.93 kg/m2  SpO2 100%  LMP 09/02/2015 Physical Exam  Constitutional: She appears well-developed and well-nourished. No distress.  Nontoxic appearing.  HENT:  Head: Normocephalic and atraumatic.  Mouth/Throat: Oropharynx is clear and moist.  Eyes: Conjunctivae are normal. Pupils are equal, round, and reactive to light. Right eye exhibits no discharge. Left eye exhibits no discharge.  Neck: Neck supple.  Cardiovascular: Normal rate, regular rhythm, normal heart sounds and intact distal pulses.  Exam reveals no gallop and no friction rub.   No murmur heard. Pulmonary/Chest: Effort normal and  breath sounds normal. No respiratory distress. She has no wheezes. She has no rales.  Abdominal: Soft. Bowel sounds are normal. There is no tenderness. There is no guarding.  Abdomen is soft and nontender to palpation. No right upper quadrant tenderness to palpation.  Musculoskeletal: Normal range of  motion. She exhibits tenderness. She exhibits no edema.  Mild tenderness lateral aspect of right lower back; no overlying skin changes. No rashes. No midline C/L/T tenderness; no edema, ecchymosis or increased warmth to back. No back deformity.   Lymphadenopathy:    She has no cervical adenopathy.  Neurological: She is alert. She has normal reflexes. She displays normal reflexes. Coordination normal.  Reflex Scores:      Patellar reflexes are 2+ on the right side and 2+ on the left side. Sensation intact to BLE Nml gait Bilateral patellar DTRs intact   Skin: Skin is warm and dry. No rash noted. She is not diaphoretic. No erythema. No pallor.  Psychiatric: She has a normal mood and affect. Her behavior is normal.  Nursing note and vitals reviewed.   ED Course  Procedures   DIAGNOSTIC STUDIES:  Oxygen Saturation is 100% on RA, normal by my interpretation.     COORDINATION OF CARE:  1:29 PM Discussed treatment plan with pt at bedside and pt agreed to plan.  Labs Review Labs Reviewed  URINALYSIS, ROUTINE W REFLEX MICROSCOPIC (NOT AT [facility]) - Abnormal; Notable for the following:    Hgb urine dipstick TRACE (*)    All other components within normal limits  URINE MICROSCOPIC-ADD ON - Abnormal; Notable for the following:    Squamous Epithelial / LPF 0-5 (*)    Bacteria, UA RARE (*)    All other components within normal limits  CBC WITH DIFFERENTIAL/PLATELET - Abnormal; Notable for the following:    RBC 5.44 (*)    MCH 24.6 (*)    All other components within normal limits  BASIC METABOLIC PANEL - Abnormal; Notable for the following:    Glucose, Bld 117 (*)    All other components within normal limits  URINE CULTURE  PREGNANCY, URINE    Imaging Review Ct Renal Stone Study  09/10/2015  CLINICAL DATA:  Right flank pain. EXAM: CT ABDOMEN AND PELVIS WITHOUT CONTRAST TECHNIQUE: Multidetector CT imaging of the abdomen and pelvis was performed following the standard protocol without  IV contrast. COMPARISON:  None. FINDINGS: Visualized lung bases are unremarkable. No significant osseous abnormality is noted. No gallstones are noted. No focal abnormality is noted in the liver, spleen or pancreas on these unenhanced images. Adrenal glands are unremarkable. Left kidney and ureter appear normal. 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 No renal or ureteral calculi are noted. The appendix appears normal. There is no evidence of bowel obstruction. No abnormal fluid collection is noted. Urinary bladder is decompressed. Ovaries appear normal. 4 cm rounded soft tissue mass is noted anteriorly in the pelvis above the urinary bladder. This may represent exophytic fibroid, but pelvic ultrasound is recommended for further evaluation and to rule out other pathology. 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 No hydronephrosis or renal obstruction is noted. No renal or ureteral calculi are noted. 4 cm rounded mass is noted anteriorly in the pelvis just above the urinary bladder. Potentially this may represent exophytic fibroid, and pelvic ultrasound is recommended  for further evaluation. If this is demonstrated not to represent a fibroid, then CT scan of the pelvis with intravenous contrast is recommended for further evaluation. Electronically Signed   By: Marijo Conception, M.D.   On: 09/10/2015 15:14   I have personally reviewed and evaluated these images and lab results as part of my medical decision-making.   EKG Interpretation None      Filed Vitals:   09/10/15 1305  BP: 119/82  Pulse: 90  Temp: 98.3 F (36.8 C)  TempSrc: Oral  Resp: 18  Height: 5' 7.5" (1.715 m)  Weight: 99.791 kg  SpO2: 100%     MDM   Meds given in ED:  Medications - No data to display  New Prescriptions    HYDROCODONE-ACETAMINOPHEN (NORCO/VICODIN) 5-325 MG TABLET    Take 1 tablet by mouth every 6 (six) hours as needed for moderate pain or severe pain.    Final diagnoses:  Uterine leiomyoma, unspecified location  Right low back pain, with sciatica presence unspecified   This is a 25 y.o. female who presents to the Emergency Department complaining of gradually worsening, right sided back pain that radiates into her right side x 2 weeks. She notes her pain waxes and wanes in severity and is exacerbated by certain movements. She has taken ibuprofen with mild relief. No colic like pain. No history of kidney stones. No pain worsening with eating. No RUQ abdominal pain. She is unable to identify other alleviating or aggravating factors other than movement or touching. She denies nausea, vomiting, dysuria, hematuria, urinary urgency/frequency, abdominal pain, bowel/bladder incontinence, vaginal bleeding/discharge, and numbness/weakness in her extremities. On exam the patient is afebrile nontoxic appearing. She has mild tenderness to her right lateral back. No overlying skin changes. Her abdomen is soft and nontender to palpation. No midline neck or back tenderness. She has no focal neurological deficits. Normal gait. Pain is reproducible with palpation. Urinalysis was obtained which showed trace hemoglobin in her urine. No evidence of infection. Pregnancy test is negative. I went to bedside to discuss these findings. I explained that these were reassuring and I did not see need for further workup at this time. She has had 2 weeks of this pain. I doubt renal stone or other emergent finding. I reassured her that this is likely musculoskeletal and I would have her continue workup with primary care for symptoms persist. Patient was very upset with this. She tells me she does not feel that I am wanting to figure out what is causing her pain. I apologized and explained that this was not the case I just did not feel she  required emergent workup in the emergency department at this time. I explained since she is concerned about follow-up with primary care was happily continue the workup here with CT scan renal stone study and blood work. Patient agrees to this plan.  CBC and BMP are unremarkable. CT renal stone study does not indicate any findings that would explain this patient's pain. 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 No hydronephrosis or renal obstruction. Gallbladder is unremarkable. I does have an incidental finding of a 4 cm mass that may represent a fibroid. It was recommended a pelvic ultrasound was obtained.  I discussed these findings with the patient and patient would like ultrasound as well. I agree with this and we will obtain pelvic ultrasound.  At the time of shift change the patient is receiving her pelvic ultrasound. Patient care handed  off to [facility] [place] at shift change who will disposition the patient. Plan is likely for discharge with follow [facility] with [facility] depending on findings.   I personally performed the services described in this documentation, which was scribed in my presence. The recorded information has been reviewed and is accurate.        Waynetta Pean, PA-C 09/10/15 1711  Nat Christen, MD 09/12/15 [phone]

## 2015-09-11 LAB — URINE CULTURE

## 2015-10-18 ENCOUNTER — Encounter (HOSPITAL_COMMUNITY): Payer: Self-pay | Admitting: Emergency Medicine

## 2015-10-18 ENCOUNTER — Emergency Department (HOSPITAL_COMMUNITY)
Admission: EM | Admit: 2015-10-18 | Discharge: 2015-10-18 | Disposition: A | Discharge status: Home / Self Care | Payer: Medicaid Other | Attending: Emergency Medicine | Admitting: Emergency Medicine

## 2015-10-18 DIAGNOSIS — E860 Dehydration: Secondary | ICD-10-CM | POA: Insufficient documentation

## 2015-10-18 DIAGNOSIS — F1290 Cannabis use, unspecified, uncomplicated: Secondary | ICD-10-CM | POA: Insufficient documentation

## 2015-10-18 DIAGNOSIS — R112 Nausea with vomiting, unspecified: Secondary | ICD-10-CM

## 2015-10-18 DIAGNOSIS — Z79899 Other long term (current) drug therapy: Secondary | ICD-10-CM | POA: Insufficient documentation

## 2015-10-18 LAB — COMPREHENSIVE METABOLIC PANEL
ALBUMIN: 4.6 g/dL (ref 3.5–5.0)
ALK PHOS: 55 U/L (ref 38–126)
ALT: 31 U/L (ref 14–54)
AST: 38 U/L (ref 15–41)
Anion gap: 10 (ref 5–15)
BUN: 10 mg/dL (ref 6–20)
CALCIUM: 9.5 mg/dL (ref 8.9–10.3)
CO2: 21 mmol/L — ABNORMAL LOW (ref 22–32)
Chloride: 109 mmol/L (ref 101–111)
Creatinine, Ser: 0.88 mg/dL (ref 0.44–1.00)
Glucose, Bld: 127 mg/dL — ABNORMAL HIGH (ref 65–99)
POTASSIUM: 4.4 mmol/L (ref 3.5–5.1)
Sodium: 140 mmol/L (ref 135–145)
Total Bilirubin: 0.9 mg/dL (ref 0.3–1.2)
Total Protein: 9.1 g/dL — ABNORMAL HIGH (ref 6.5–8.1)

## 2015-10-18 LAB — CBC WITH DIFFERENTIAL/PLATELET
BASOS PCT: 0 %
Basophils Absolute: 0 10*3/uL (ref 0.0–0.1)
Eosinophils Absolute: 0 10*3/uL (ref 0.0–0.7)
Eosinophils Relative: 0 %
HEMATOCRIT: 41.7 % (ref 36.0–46.0)
HEMOGLOBIN: 13.2 g/dL (ref 12.0–15.0)
LYMPHS ABS: 1.4 10*3/uL (ref 0.7–4.0)
Lymphocytes Relative: 8 %
MCH: 24.6 pg — AB (ref 26.0–34.0)
MCHC: 31.7 g/dL (ref 30.0–36.0)
MCV: 77.8 fL — AB (ref 78.0–100.0)
MONO ABS: 0.4 10*3/uL (ref 0.1–1.0)
MONOS PCT: 3 %
NEUTROS ABS: 15.2 10*3/uL — AB (ref 1.7–7.7)
Neutrophils Relative %: 89 %
Platelets: 329 10*3/uL (ref 150–400)
RBC: 5.36 MIL/uL — ABNORMAL HIGH (ref 3.87–5.11)
RDW: 15.1 % (ref 11.5–15.5)
WBC: 17 10*3/uL — ABNORMAL HIGH (ref 4.0–10.5)

## 2015-10-18 LAB — URINALYSIS, ROUTINE W REFLEX MICROSCOPIC
BILIRUBIN URINE: NEGATIVE
GLUCOSE, UA: NEGATIVE mg/dL
HGB URINE DIPSTICK: NEGATIVE
Ketones, ur: NEGATIVE mg/dL
Leukocytes, UA: NEGATIVE
Nitrite: NEGATIVE
PROTEIN: NEGATIVE mg/dL
Specific Gravity, Urine: 1.028 (ref 1.005–1.030)
pH: 6.5 (ref 5.0–8.0)

## 2015-10-18 LAB — I-STAT BETA HCG BLOOD, ED (MC, WL, AP ONLY)

## 2015-10-18 LAB — LIPASE, BLOOD: LIPASE: 24 U/L (ref 11–51)

## 2015-10-18 MED ORDER — SODIUM CHLORIDE 0.9 % IV BOLUS (SEPSIS)
1000.0000 mL | Freq: Once | INTRAVENOUS | Status: AC
  Administered 2015-10-18: 1000 mL via INTRAVENOUS

## 2015-10-18 MED ORDER — ONDANSETRON HCL 4 MG PO TABS: 4.0000 mg | ORAL_TABLET | Freq: Four times a day (QID) | ORAL | 0 refills | Status: DC

## 2015-10-18 MED ORDER — DIPHENOXYLATE-ATROPINE 2.5-0.025 MG PO TABS: 2.0000 | ORAL_TABLET | Freq: Four times a day (QID) | ORAL | 0 refills | Status: DC | PRN

## 2015-10-18 MED ORDER — ONDANSETRON HCL 4 MG/2ML IJ SOLN
4.0000 mg | Freq: Once | INTRAMUSCULAR | Status: AC
  Administered 2015-10-18: 4 mg via INTRAVENOUS
  Filled 2015-10-18: qty 2

## 2015-10-18 NOTE — ED Provider Notes (Signed)
[facility] DEPT Provider Note   CSN: [id number] Arrival date & time: 10/18/15  0510     History   Chief Complaint Chief Complaint  Patient presents with  . Emesis    HPI Tiffany Sullivan is a 25 y.o. female.  Patient presents to the ER for evaluation of nausea and vomiting that started at approximately 1 AM. Patient is not experiencing any abdominal pain, just having severe nausea and discomfort secondary to dry heaves. She has not been able to eat or drink anything. She has not had any fever. Patient denies urinary symptoms.      Past Medical History:  Diagnosis Date  . Allergy   . Anemia   . GERD (gastroesophageal reflux disease)   . Syncope and collapse     Patient Active Problem List   Diagnosis Date Noted  . Obesity 03/13/2015  . Hidradenitis 03/13/2015    History reviewed. No pertinent surgical history.  OB History    Gravida Para Term Preterm AB Living   0 0           SAB TAB Ectopic Multiple Live Births                   Home Medications    Prior to Admission medications   Medication Sig Start Date End Date Taking? Authorizing Provider  acetaminophen (TYLENOL) 500 MG tablet Take 500 mg by mouth every 6 (six) hours as needed for headache.   Yes Historical Provider, MD  cyclobenzaprine (FLEXERIL) 5 MG tablet Take 1 tablet (5 mg total) by mouth 2 (two) times daily as needed for muscle spasms. Patient not taking: Reported on 10/18/2015 09/10/15   Ashley Murrain, NP  diphenoxylate-atropine (LOMOTIL) 2.5-0.025 MG tablet Take 2 tablets by mouth 4 (four) times daily as needed for diarrhea or loose stools. 10/18/15   Orpah Greek, MD  HYDROcodone-acetaminophen (NORCO/VICODIN) 5-325 MG tablet Take 1 tablet by mouth every 6 (six) hours as needed for moderate pain or severe pain. Patient not taking: Reported on 10/18/2015 09/10/15   Waynetta Pean, PA-C  naproxen (NAPROSYN) 500 MG tablet Take 1 tablet (500 mg total) by mouth 2 (two) times daily with a  meal. Patient not taking: Reported on 10/18/2015 09/10/15   Waynetta Pean, PA-C  ondansetron (ZOFRAN) 4 MG tablet Take 1 tablet (4 mg total) by mouth every 6 (six) hours. 10/18/15   Orpah Greek, MD    Family History History reviewed. No pertinent family history.  Social History Social History  Substance Use Topics  . Smoking status: Never Smoker  . Smokeless tobacco: Never Used  . Alcohol use 0.0 oz/week     Comment: occasional (every other weekend)     Allergies   Review of patient's allergies indicates no known allergies.   Review of Systems Review of Systems  Gastrointestinal: Positive for nausea and vomiting.  All other systems reviewed and are negative.    Physical Exam Updated Vital Signs BP 169/88 (BP Location: Left Arm)   Pulse 79   Temp 97.8 F (36.6 C) (Oral)   Resp 18   LMP 10/05/2015 (Approximate)   SpO2 99%   Physical Exam  Constitutional: She is oriented to person, place, and time. She appears well-developed and well-nourished. No distress.  HENT:  Head: Normocephalic and atraumatic.  Right Ear: Hearing normal.  Left Ear: Hearing normal.  Nose: Nose normal.  Mouth/Throat: Oropharynx is clear and moist. Mucous membranes are dry.  Eyes: Conjunctivae and EOM are normal. Pupils are  equal, round, and reactive to light.  Neck: Normal range of motion. Neck supple.  Cardiovascular: Regular rhythm, S1 normal and S2 normal.  Exam reveals no gallop and no friction rub.   No murmur heard. Pulmonary/Chest: Effort normal and breath sounds normal. No respiratory distress. She exhibits no tenderness.  Abdominal: Soft. Normal appearance and bowel sounds are normal. There is no hepatosplenomegaly. There is no tenderness. There is no rebound, no guarding, no tenderness at McBurney's point and negative Murphy's sign. No hernia.  Musculoskeletal: Normal range of motion.  Neurological: She is alert and oriented to person, place, and time. She has normal strength.  No cranial nerve deficit or sensory deficit. Coordination normal. GCS eye subscore is 4. GCS verbal subscore is 5. GCS motor subscore is 6.  Skin: Skin is warm, dry and intact. No rash noted. No cyanosis.  Psychiatric: She has a normal mood and affect. Her speech is normal and behavior is normal. Thought content normal.  Nursing note and vitals reviewed.    ED Treatments / Results  Labs (all labs ordered are listed, but only abnormal results are displayed) Labs Reviewed  CBC WITH DIFFERENTIAL/PLATELET - Abnormal; Notable for the following:       Result Value   WBC 17.0 (*)    RBC 5.36 (*)    MCV 77.8 (*)    MCH 24.6 (*)    Neutro Abs 15.2 (*)    All other components within normal limits  COMPREHENSIVE METABOLIC PANEL - Abnormal; Notable for the following:    CO2 21 (*)    Glucose, Bld 127 (*)    Total Protein 9.1 (*)    All other components within normal limits  LIPASE, BLOOD  URINALYSIS, ROUTINE W REFLEX MICROSCOPIC (NOT AT [facility])  I-STAT BETA HCG BLOOD, ED ([facility], [facility], [facility] ONLY)    EKG  EKG Interpretation None       Radiology No results found.  Procedures Procedures (including critical care time)  Medications Ordered in ED Medications  sodium chloride 0.9 % bolus 1,000 mL (1,000 mLs Intravenous New Bag/Given 10/18/15 0541)  ondansetron (ZOFRAN) injection 4 mg (4 mg Intravenous Given 10/18/15 0541)     Initial Impression / Assessment and Plan / ED Course  I have reviewed the triage vital signs and the nursing notes.  Pertinent labs & imaging results that were available during my care of the patient were reviewed by me and considered in my medical decision making (see chart for details).  Clinical Course   Patient presented with acute onset of nausea and vomiting overnight. She did not have any associated abdominal pain. Abdominal exam at arrival was benign although she did appear to be uncomfortable with nausea and heaving. She was given Zofran IV fluids and has had  significant improvement. No further nausea or vomiting, tolerating oral intake without difficulty. Examination still benign. She did have a leukocytosis which is likely stress response from the vomiting, no concern for intra-abdominal infection or surgical process based on exam.  Final Clinical Impressions(s) / ED Diagnoses   Final diagnoses:  Nausea and vomiting, vomiting of unspecified type  Dehydration    New Prescriptions New Prescriptions   DIPHENOXYLATE-ATROPINE (LOMOTIL) 2.5-0.025 MG TABLET    Take 2 tablets by mouth 4 (four) times daily as needed for diarrhea or loose stools.   ONDANSETRON (ZOFRAN) 4 MG TABLET    Take 1 tablet (4 mg total) by mouth every 6 (six) hours.     Orpah Greek, MD 10/18/15 [phone]

## 2015-10-18 NOTE — ED Triage Notes (Signed)
Pt states she started vomiting around 1230-1am and now only has dry heaves  Pt denies any pain or diarrhea

## 2016-01-09 ENCOUNTER — Emergency Department (HOSPITAL_COMMUNITY)
Admission: EM | Admit: 2016-01-09 | Discharge: 2016-01-09 | Disposition: A | Discharge status: Home / Self Care | Payer: Medicaid Other | Attending: Emergency Medicine | Admitting: Emergency Medicine

## 2016-01-09 ENCOUNTER — Encounter (HOSPITAL_COMMUNITY): Payer: Self-pay | Admitting: Emergency Medicine

## 2016-01-09 DIAGNOSIS — R1310 Dysphagia, unspecified: Secondary | ICD-10-CM | POA: Insufficient documentation

## 2016-01-09 DIAGNOSIS — R1319 Other dysphagia: Secondary | ICD-10-CM

## 2016-01-09 NOTE — ED Provider Notes (Signed)
[facility] DEPT Provider Note   CSN: [id number] Arrival date & time: 01/09/16  2149  By signing my name below, I, Soijett Blue, attest that this documentation has been prepared under the direction and in the presence of Varney Biles, MD. Electronically Signed: Soijett Blue, ED Scribe. 01/09/16. 11:23 PM.   History   Chief Complaint No chief complaint on file.   HPI Tiffany Sullivan is a 25 y.o. female who presents to the Emergency Department complaining of throat issues onset PTA. Pt notes that she feels as if her throat is closing and that she is unable to take an adequate breath or swallow food properly due to a feeling like there is "dry phlegm that isn't going anywhere." Pt states that her symptoms are worsened with swallowing. Onset of symptoms was 4 days ago. Pt denies alleviating factors for her throat issues. She states that she has not tried any medications for the relief for her symptoms. She denies nasal congestion, sore throat, nausea, vomiting, voice change, wheezing, food regurgutation and any other symptoms.     The history is provided by the patient. No language interpreter was used.    Past Medical History:  Diagnosis Date  . Allergy   . Anemia   . GERD (gastroesophageal reflux disease)   . Syncope and collapse     Patient Active Problem List   Diagnosis Date Noted  . Obesity 03/13/2015  . Hidradenitis 03/13/2015    History reviewed. No pertinent surgical history.  OB History    Gravida Para Term Preterm AB Living   0 0           SAB TAB Ectopic Multiple Live Births                   Home Medications    Prior to Admission medications   Medication Sig Start Date End Date Taking? Authorizing Provider  acetaminophen (TYLENOL) 500 MG tablet Take 500 mg by mouth every 6 (six) hours as needed for headache.    Historical Provider, MD  cyclobenzaprine (FLEXERIL) 5 MG tablet Take 1 tablet (5 mg total) by mouth 2 (two) times daily as needed for muscle  spasms. Patient not taking: Reported on 10/18/2015 09/10/15   Ashley Murrain, NP  diphenoxylate-atropine (LOMOTIL) 2.5-0.025 MG tablet Take 2 tablets by mouth 4 (four) times daily as needed for diarrhea or loose stools. 10/18/15   Orpah Greek, MD  HYDROcodone-acetaminophen (NORCO/VICODIN) 5-325 MG tablet Take 1 tablet by mouth every 6 (six) hours as needed for moderate pain or severe pain. Patient not taking: Reported on 10/18/2015 09/10/15   Waynetta Pean, PA-C  naproxen (NAPROSYN) 500 MG tablet Take 1 tablet (500 mg total) by mouth 2 (two) times daily with a meal. Patient not taking: Reported on 10/18/2015 09/10/15   Waynetta Pean, PA-C  ondansetron (ZOFRAN) 4 MG tablet Take 1 tablet (4 mg total) by mouth every 6 (six) hours. 10/18/15   Orpah Greek, MD    Family History No family history on file.  Social History Social History  Substance Use Topics  . Smoking status: Never Smoker  . Smokeless tobacco: Never Used  . Alcohol use 0.0 oz/week     Comment: occasional (every other weekend)     Allergies   Patient has no known allergies.   Review of Systems Review of Systems A complete 10 system review of systems was obtained and all systems are negative except as noted in the HPI and PMH.   Physical Exam  Updated Vital Signs BP 143/89 (BP Location: Right Arm)   Pulse 81   Temp 98.1 F (36.7 C) (Oral)   Resp 16   Ht 5\' 7"  (1.702 m)   Wt 215 lb (97.5 kg)   LMP 12/22/2015 (Approximate)   SpO2 98%   BMI 33.67 kg/m   Physical Exam  Constitutional: She is oriented to person, place, and time. She appears well-developed and well-nourished. No distress.  HENT:  Head: Normocephalic and atraumatic.  Mouth/Throat: Uvula is midline, oropharynx is clear and moist and mucous membranes are normal. No oropharyngeal exudate or posterior oropharyngeal edema.  Posterior pharynx clear. No tonsillar exudate or enlargement.  Eyes: EOM are normal.  Neck: Neck supple. Carotid bruit  is not present. No thyromegaly present.  Ausculation of the neck reveals no bruits. No audible stridor. No prominent thyromegaly noted.   Cardiovascular: Normal rate, regular rhythm and normal heart sounds.  Exam reveals no gallop and no friction rub.   No murmur heard. Pulmonary/Chest: Effort normal and breath sounds normal. No stridor. No respiratory distress. She has no wheezes. She has no rales.  Anterior lungs are clear.  Abdominal: Soft. She exhibits no distension. There is no tenderness.  Musculoskeletal: Normal range of motion.  Neurological: She is alert and oriented to person, place, and time.  Skin: Skin is warm and dry.  Psychiatric: She has a normal mood and affect. Her behavior is normal.  Nursing note and vitals reviewed.    ED Treatments / Results  DIAGNOSTIC STUDIES: Oxygen Saturation is 98% on RA, nl by my interpretation.    COORDINATION OF CARE: 11:21 PM Discussed treatment plan with pt at bedside which includes referral to specialist and pt agreed to plan.   Procedures Procedures (including critical care time)  Medications Ordered in ED Medications - No data to display   Initial Impression / Assessment and Plan / ED Course  I have reviewed the triage vital signs and the nursing notes.   Clinical Course    I personally performed the services described in this documentation, which was scribed in my presence. The recorded information has been reviewed and is accurate.   Pt comes in with cc of throat trouble. She has no stridor, no drooling, no trismus and the oral exam doesn't reveal any abnormalities. Moreover, the voice is normal, the thyroid exam is grossly normal as well. It is possible that pt might have a polyp in the esophagus. She relates that her symptoms are more prominent with swallowing, and so we have requested GI f/u for possible scope. No indication for emergent imaging. Strict ER return precautions have been discussed, and patient is agreeing  with the plan and is comfortable with the workup done and the recommendations from the ER.  Final Clinical Impressions(s) / ED Diagnoses   Final diagnoses:  Esophageal dysphagia    New Prescriptions New Prescriptions   No medications on file       Varney Biles, MD 01/09/16 2339

## 2016-01-09 NOTE — ED Triage Notes (Signed)
Pt comes with complaints of throat tightness stating she feels like she has something in her throat that she cannot get to come up.  States she has no issues swallowing.  Verbal in triage.  She has been taking mucinex with no relief.

## 2016-01-09 NOTE — Discharge Instructions (Signed)
We are not sure why you are having the discomfort. The examination doesn't show any abnormality in the mouth. You can have possible polyp or other pathology causing the symptoms you have.  See the GI doctor as requested.  Return to the ER immediately if you are unable to keep any food down or you are having significant trouble breathing.

## 2016-06-04 ENCOUNTER — Ambulatory Visit: Payer: Medicaid Other | Admitting: Obstetrics & Gynecology

## 2016-06-17 ENCOUNTER — Encounter: Payer: Self-pay | Admitting: Obstetrics & Gynecology

## 2016-06-17 ENCOUNTER — Ambulatory Visit (INDEPENDENT_AMBULATORY_CARE_PROVIDER_SITE_OTHER): Payer: 59 | Admitting: Obstetrics & Gynecology

## 2016-06-17 VITALS — Wt 237.1 lb

## 2016-06-17 DIAGNOSIS — L732 Hidradenitis suppurativa: Secondary | ICD-10-CM

## 2016-06-17 DIAGNOSIS — O3410 Maternal care for benign tumor of corpus uteri, unspecified trimester: Secondary | ICD-10-CM

## 2016-06-17 DIAGNOSIS — D259 Leiomyoma of uterus, unspecified: Secondary | ICD-10-CM | POA: Diagnosis not present

## 2016-06-17 MED ORDER — SULFAMETHOXAZOLE-TRIMETHOPRIM 800-160 MG PO TABS: 1.0000 | ORAL_TABLET | Freq: Two times a day (BID) | ORAL | 0 refills | Status: DC

## 2016-06-17 NOTE — Patient Instructions (Signed)
Uterine Fibroids Uterine fibroids are tissue masses (tumors). They are also called leiomyomas. They can develop inside of a woman's womb (uterus). They can grow very large. Fibroids are not cancerous (benign). Most fibroids do not require medical treatment. Follow these instructions at home:  Keep all follow-up visits as told by your doctor. This is important.  Take medicines only as told by your doctor.  If you were prescribed a hormone treatment, take the hormone medicines exactly as told.  Do not take aspirin. It can cause bleeding.  Ask your doctor about taking iron pills and increasing the amount of dark green, leafy vegetables in your diet. These actions can help to boost your blood iron levels.  Pay close attention to your period. Tell your doctor about any changes, such as:  Increased blood flow. This may require you to use more pads or tampons than usual per month.  A change in the number of days that your period lasts per month.  A change in symptoms that come with your period, such as back pain or cramping in your belly area (abdomen). Contact a doctor if:  You have pain in your back or the area between your hip bones (pelvic area) that is not controlled by medicines.  You have pain in your abdomen that is not controlled with medicines.  You have an increase in bleeding between and during periods.  You soak tampons or pads in a half hour or less.  You feel lightheaded.  You feel extra tired.  You feel weak. Get help right away if:  You pass out (faint).  You have a sudden increase in pelvic pain. This information is not intended to replace advice given to you by your health care provider. Make sure you discuss any questions you have with your health care provider. Document Released: 03/14/2010 Document Revised: 10/11/2015 Document Reviewed: 08/08/2013 Elsevier Interactive Patient Education  2017 [employer].

## 2016-06-17 NOTE — Progress Notes (Signed)
Patient ID: Tiffany Sullivan, female   DOB: 28-Feb-1990, 26 y.o.   MRN: [id number]  Chief Complaint  Patient presents with  . Results    DISCUSS LOCATION SIZE & OPTIONS  Fibroid uterus  HPI Tiffany Sullivan is a 26 y.o. female.  G0P0 Regular menses, comes today to have pap if needed and discuss dx of fibroid uterus made by [place] and CT 08/2015. No pain or mass Sx. She has used no contraception with the same partner since 09/2015 and wonders if she is fertile. Her partner has no children. HPI  Past Medical History:  Diagnosis Date  . Allergy   . Anemia   . GERD (gastroesophageal reflux disease)   . Syncope and collapse     No past surgical history on file.  No family history on file.  Social History Social History  Substance Use Topics  . Smoking status: Never Smoker  . Smokeless tobacco: Never Used  . Alcohol use 0.0 oz/week     Comment: occasional (every other weekend)    No Known Allergies  Current Outpatient Prescriptions  Medication Sig Dispense Refill  . sulfamethoxazole-trimethoprim (BACTRIM DS,SEPTRA DS) 800-160 MG tablet Take 1 tablet by mouth 2 (two) times daily. 14 tablet 0   No current facility-administered medications for this visit.    Patient Active Problem List   Diagnosis Date Noted  . Uterine fibroid 06/17/2016  . Obesity 03/13/2015  . Hidradenitis 03/13/2015     Review of Systems Review of Systems  Constitutional: Negative.   Gastrointestinal: Negative.   Genitourinary: Negative for dyspareunia, menstrual problem, pelvic pain, vaginal bleeding and vaginal discharge.  Skin:       Boils on her breasts and dark spots    Weight 107.6 kg (237 lb 2 oz).  Physical Exam Physical Exam  Constitutional: She is oriented to person, place, and time. She appears well-developed. No distress.  HENT:  Head: Normocephalic.  Cardiovascular: Normal rate.   Pulmonary/Chest: Effort normal. No respiratory distress.  Large breasts with superficial boils on left side,  otherwith no abnl pigmentation, no mass  Abdominal: Soft. There is no tenderness.  Genitourinary:  Genitourinary Comments: deferred  Neurological: She is alert and oriented to person, place, and time.  Skin: Skin is warm.  Psychiatric:  Mildly anxious    Data Reviewed  CLINICAL DATA:  Right-sided flank, abdominal and pelvic pain. Possible uterine fibroid on CT.  EXAM: TRANSABDOMINAL AND TRANSVAGINAL ULTRASOUND OF PELVIS  TECHNIQUE: Both transabdominal and transvaginal ultrasound examinations of the pelvis were performed. Transabdominal technique was performed for global imaging of the pelvis including uterus, ovaries, adnexal regions, and pelvic cul-de-sac. It was necessary to proceed with endovaginal exam following the transabdominal exam to visualize the uterus, ovaries and adnexa.  COMPARISON:  Noncontrast CT abdomen/ pelvis earlier this day  FINDINGS: Uterus  Measurements: 7.9 x 3.4 x 4.7 cm. There is an exophytic pedunculated fibroid from the right aspect of the uterine fundus measuring 3.6 x 3.7 x 3.1 cm. Nabothian cyst in the cervix.  Endometrium  Thickness: 9.1 mm.  No focal abnormality visualized.  Right ovary  Measurements: 3.5 x 2.2 x 1.6 cm. Normal appearance/no adnexal mass. There are physiologic follicles. Blood flow is seen.  Left ovary  Measurements: 4.1 x 3.0 x 2.5 cm. Normal appearance/no adnexal mass. There are physiologic follicles with a follicular cyst measuring 1.5 cm. Blood flow is seen.  Other findings  No abnormal free fluid.  IMPRESSION: Exophytic 3.7 cm fibroid from the right uterine fundus corresponding to  abnormality on CT.  Normal sonographic appearance of the ovaries.   Electronically Signed   By: Jeb Levering M.D.   On: 09/10/2015 17:32  Assessment    Folliculitis left breast Pedunculated fibroid and will f/u with repeat US Worried about fertility 8 mo no contraception    Plan    Pelvic  US  Bactrim BID 7 days Menstrual calendar and consider ovulation indicator, partner may get SA   25 min face to face and coordination of care    Emeterio Reeve 06/17/2016, 4:04 PM

## 2016-07-10 ENCOUNTER — Inpatient Hospital Stay (HOSPITAL_COMMUNITY)
Admission: AD | Admit: 2016-07-10 | Discharge: 2016-07-10 | Disposition: A | Discharge status: Home / Self Care | Payer: 59 | Source: Ambulatory Visit | Attending: Obstetrics & Gynecology | Admitting: Obstetrics & Gynecology

## 2016-07-10 ENCOUNTER — Inpatient Hospital Stay (HOSPITAL_COMMUNITY): Payer: 59

## 2016-07-10 ENCOUNTER — Encounter (HOSPITAL_COMMUNITY): Payer: Self-pay | Admitting: *Deleted

## 2016-07-10 DIAGNOSIS — Z3A01 Less than 8 weeks gestation of pregnancy: Secondary | ICD-10-CM | POA: Insufficient documentation

## 2016-07-10 DIAGNOSIS — D252 Subserosal leiomyoma of uterus: Secondary | ICD-10-CM | POA: Insufficient documentation

## 2016-07-10 DIAGNOSIS — O3412 Maternal care for benign tumor of corpus uteri, second trimester: Secondary | ICD-10-CM | POA: Diagnosis not present

## 2016-07-10 DIAGNOSIS — O26891 Other specified pregnancy related conditions, first trimester: Secondary | ICD-10-CM | POA: Diagnosis not present

## 2016-07-10 DIAGNOSIS — R51 Headache: Secondary | ICD-10-CM | POA: Insufficient documentation

## 2016-07-10 DIAGNOSIS — O219 Vomiting of pregnancy, unspecified: Secondary | ICD-10-CM | POA: Diagnosis not present

## 2016-07-10 DIAGNOSIS — O209 Hemorrhage in early pregnancy, unspecified: Secondary | ICD-10-CM | POA: Diagnosis not present

## 2016-07-10 DIAGNOSIS — R519 Headache, unspecified: Secondary | ICD-10-CM

## 2016-07-10 DIAGNOSIS — Z3491 Encounter for supervision of normal pregnancy, unspecified, first trimester: Secondary | ICD-10-CM

## 2016-07-10 HISTORY — DX: Benign neoplasm of connective and other soft tissue, unspecified: D21.9

## 2016-07-10 LAB — URINALYSIS, ROUTINE W REFLEX MICROSCOPIC
Glucose, UA: NEGATIVE mg/dL
KETONES UR: 40 mg/dL — AB
Leukocytes, UA: NEGATIVE
Nitrite: NEGATIVE
Protein, ur: NEGATIVE mg/dL
SPECIFIC GRAVITY, URINE: 1.02 (ref 1.005–1.030)
pH: 6 (ref 5.0–8.0)

## 2016-07-10 LAB — CBC
HEMATOCRIT: 35.6 % — AB (ref 36.0–46.0)
Hemoglobin: 11.4 g/dL — ABNORMAL LOW (ref 12.0–15.0)
MCH: 25.1 pg — ABNORMAL LOW (ref 26.0–34.0)
MCHC: 32 g/dL (ref 30.0–36.0)
MCV: 78.2 fL (ref 78.0–100.0)
Platelets: 271 10*3/uL (ref 150–400)
RBC: 4.55 MIL/uL (ref 3.87–5.11)
RDW: 15.1 % (ref 11.5–15.5)
WBC: 13.2 10*3/uL — ABNORMAL HIGH (ref 4.0–10.5)

## 2016-07-10 LAB — HCG, QUANTITATIVE, PREGNANCY: hCG, Beta Chain, Quant, S: 17927 m[IU]/mL — ABNORMAL HIGH (ref <5)

## 2016-07-10 LAB — URINALYSIS, MICROSCOPIC (REFLEX): WBC UA: NONE SEEN WBC/hpf (ref 0–5)

## 2016-07-10 LAB — WET PREP, GENITAL
Clue Cells Wet Prep HPF POC: NONE SEEN
SPERM: NONE SEEN
Trich, Wet Prep: NONE SEEN
Yeast Wet Prep HPF POC: NONE SEEN

## 2016-07-10 LAB — POCT PREGNANCY, URINE: PREG TEST UR: POSITIVE — AB

## 2016-07-10 LAB — ABO/RH: ABO/RH(D): O POS

## 2016-07-10 MED ORDER — DEXAMETHASONE SODIUM PHOSPHATE 10 MG/ML IJ SOLN
10.0000 mg | Freq: Once | INTRAMUSCULAR | Status: AC
  Administered 2016-07-10: 10 mg via INTRAVENOUS
  Filled 2016-07-10: qty 1

## 2016-07-10 MED ORDER — DIPHENHYDRAMINE HCL 50 MG/ML IJ SOLN
25.0000 mg | Freq: Once | INTRAMUSCULAR | Status: AC
Start: 2016-07-10 — End: 2016-07-10
  Administered 2016-07-10: 25 mg via INTRAVENOUS
  Filled 2016-07-10: qty 1

## 2016-07-10 MED ORDER — LACTATED RINGERS IV BOLUS (SEPSIS)
1000.0000 mL | Freq: Once | INTRAVENOUS | Status: AC
  Administered 2016-07-10: 1000 mL via INTRAVENOUS

## 2016-07-10 MED ORDER — PROMETHAZINE HCL 25 MG PO TABS: 25.0000 mg | ORAL_TABLET | Freq: Four times a day (QID) | ORAL | 0 refills | Status: DC | PRN

## 2016-07-10 MED ORDER — METOCLOPRAMIDE HCL 5 MG/ML IJ SOLN
10.0000 mg | Freq: Once | INTRAMUSCULAR | Status: AC
  Administered 2016-07-10: 10 mg via INTRAVENOUS
  Filled 2016-07-10: qty 2

## 2016-07-10 NOTE — MAU Provider Note (Signed)
History     CSN: [id number]  Arrival date and time: 07/10/16 1126  First Provider Initiated Contact with Patient 07/10/16 1209      Chief Complaint  Patient presents with  . Vaginal Bleeding   HPI  Tiffany Sullivan is a 26 y.o. G1P0 at [redacted]w[redacted]d by LMP who presents with vaginal bleeding. Reports pink/red spotting on toilet paper intermittently since Sunday after having intercourse. Spotting has been brown today. Denies n/v/d, constipation, dysuria, vaginal discharge, or abdominal pain.   OB History    Gravida Para Term Preterm AB Living   1 0           SAB TAB Ectopic Multiple Live Births                  Past Medical History:  Diagnosis Date  . Allergy   . Anemia   . Fibroid    1 small located on top of ovary  . GERD (gastroesophageal reflux disease)   . Syncope and collapse     No past surgical history on file.  No family history on file.  Social History  Substance Use Topics  . Smoking status: Never Smoker  . Smokeless tobacco: Never Used  . Alcohol use 0.0 oz/week     Comment: none since Monday    Allergies: No Known Allergies  Prescriptions Prior to Admission  Medication Sig Dispense Refill Last Dose  . ibuprofen (ADVIL,MOTRIN) 200 MG tablet Take 400 mg by mouth every 6 (six) hours as needed for headache or mild pain.   07/08/2016  . sulfamethoxazole-trimethoprim (BACTRIM DS,SEPTRA DS) 800-160 MG tablet Take 1 tablet by mouth 2 (two) times daily. (Patient not taking: Reported on 07/10/2016) 14 tablet 0 Not Taking at Unknown time    Review of Systems  Constitutional: Negative.   Gastrointestinal: Positive for nausea. Negative for abdominal pain, constipation, diarrhea and vomiting.  Genitourinary: Positive for vaginal bleeding. Negative for dyspareunia, dysuria and vaginal discharge.   Physical Exam   Blood pressure (!) 142/76, pulse 93, temperature 98.3 F (36.8 C), temperature source Oral, resp. rate 18, height 5' 8.5" (1.74 m), weight 236 lb 12 oz (107.4 kg),  last menstrual period 06/02/2016, SpO2 100 %.  Physical Exam  Nursing note and vitals reviewed. Constitutional: She is oriented to person, place, and time. She appears well-developed and well-nourished. No distress.  HENT:  Head: Normocephalic and atraumatic.  Eyes: Conjunctivae are normal. Right eye exhibits no discharge. Left eye exhibits no discharge. No scleral icterus.  Neck: Normal range of motion.  Cardiovascular: Normal rate, regular rhythm and normal heart sounds.   No murmur heard. Respiratory: Effort normal and breath sounds normal. No respiratory distress. She has no wheezes.  GI: Soft. Bowel sounds are normal. There is no tenderness.  Genitourinary: Uterus is enlarged. Cervix exhibits no motion tenderness and no friability. There is bleeding (minimal amount of dark brown blood; no active bleeding) in the vagina.  Genitourinary Comments: Cervix closed  Neurological: She is alert and oriented to person, place, and time.  Skin: Skin is warm and dry. She is not diaphoretic.  Psychiatric: She has a normal mood and affect. Her behavior is normal. Judgment and thought content normal.    MAU Course  Procedures Results for orders placed or performed during the hospital encounter of 07/10/16 (from the past 48 hour(s))  Urinalysis, Routine w reflex microscopic     Status: Abnormal   Collection Time: 07/10/16 11:32 AM  Result Value Ref Range   Color, Urine  YELLOW YELLOW   APPearance CLEAR CLEAR   Specific Gravity, Urine 1.020 1.005 - 1.030   pH 6.0 5.0 - 8.0   Glucose, UA NEGATIVE NEGATIVE mg/dL   Hgb urine dipstick LARGE (A) NEGATIVE   Bilirubin Urine SMALL (A) NEGATIVE   Ketones, ur 40 (A) NEGATIVE mg/dL   Protein, ur NEGATIVE NEGATIVE mg/dL   Nitrite NEGATIVE NEGATIVE   Leukocytes, UA NEGATIVE NEGATIVE  Urinalysis, Microscopic (reflex)     Status: Abnormal   Collection Time: 07/10/16 11:32 AM  Result Value Ref Range   RBC / HPF 6-30 0 - 5 RBC/hpf   WBC, UA NONE SEEN 0 - 5  WBC/hpf   Bacteria, UA RARE (A) NONE SEEN   Squamous Epithelial / LPF 0-5 (A) NONE SEEN  Pregnancy, urine POC     Status: Abnormal   Collection Time: 07/10/16 11:37 AM  Result Value Ref Range   Preg Test, Ur POSITIVE (A) NEGATIVE    Comment:        THE SENSITIVITY OF THIS METHODOLOGY IS >24 mIU/mL   CBC     Status: Abnormal   Collection Time: 07/10/16 12:14 PM  Result Value Ref Range   WBC 13.2 (H) 4.0 - 10.5 K/uL   RBC 4.55 3.87 - 5.11 MIL/uL   Hemoglobin 11.4 (L) 12.0 - 15.0 g/dL   HCT 35.6 (L) 36.0 - 46.0 %   MCV 78.2 78.0 - 100.0 fL   MCH 25.1 (L) 26.0 - 34.0 pg   MCHC 32.0 30.0 - 36.0 g/dL   RDW 15.1 11.5 - 15.5 %   Platelets 271 150 - 400 K/uL  hCG, quantitative, pregnancy     Status: Abnormal   Collection Time: 07/10/16 12:14 PM  Result Value Ref Range   hCG, Beta Chain, Quant, S 17,927 (H) <5 mIU/mL    Comment:          GEST. AGE      CONC.  (mIU/mL)   <=1 WEEK        5 - 50     2 WEEKS       50 - 500     3 WEEKS       100 - 10,000     4 WEEKS     1,000 - 30,000     5 WEEKS     3,500 - 115,000   6-8 WEEKS     12,000 - 270,000    12 WEEKS     15,000 - 220,000        FEMALE AND NON-PREGNANT FEMALE:     LESS THAN 5 mIU/mL   ABO/Rh     Status: None   Collection Time: 07/10/16 12:14 PM  Result Value Ref Range   ABO/RH(D) O POS   Wet prep, genital     Status: Abnormal   Collection Time: 07/10/16 12:15 PM  Result Value Ref Range   Yeast Wet Prep HPF POC NONE SEEN NONE SEEN   Trich, Wet Prep NONE SEEN NONE SEEN   Clue Cells Wet Prep HPF POC NONE SEEN NONE SEEN   WBC, Wet Prep HPF POC MANY (A) NONE SEEN    Comment: MODERATE BACTERIA SEEN   Sperm NONE SEEN    US Ob Comp Less 14 Wks  Result Date: 07/10/2016 CLINICAL DATA:  First-trimester pregnancy.  Vaginal bleeding. EXAM: OBSTETRIC <14 WK [place] AND TRANSVAGINAL OB US TECHNIQUE: Both transabdominal and transvaginal ultrasound examinations were performed for complete evaluation of the gestation as well as the maternal  uterus, adnexal regions, and pelvic cul-de-sac. Transvaginal technique was performed to assess early pregnancy. COMPARISON:  None for this pregnancy. Previous pelvic ultrasound 09/10/2015 FINDINGS: Intrauterine gestational sac: Single Yolk sac:  Present Embryo:  Not visualized Cardiac Activity: Not visualized MSD: 12.1  mm   6 w   0  d             [place] EDC: 03/05/2016 Subchorionic hemorrhage: A small subchorionic hemorrhage is present along the left side of the gestational sac. Maternal uterus/adnexae: A prominent subserosal fibroid is present on the right side of the uterus measuring 4.3 x 5.4 x 4.1 cm. The ovaries are within normal limits bilaterally. The corpus luteal cyst is noted on the left. IMPRESSION: 1. Normal appearance of the single intrauterine gestational sac with a mean sac diameter of 12.1 mm, corresponding to an estimated gestational age of [redacted] weeks and 0 days. 2. Although the yolk sac is present, no embryo or cardiac activity is visualized at this point, within normal limits. 3. Small subchorionic hemorrhage may account for the vaginal bleeding. 4. Prominent right-sided subserosal fibroid measuring up to 5.4 cm. Electronically Signed   By: San Morelle M.D.   On: 07/10/2016 16:29   US Ob Transvaginal  Result Date: 07/10/2016 CLINICAL DATA:  First-trimester pregnancy.  Vaginal bleeding. EXAM: OBSTETRIC <14 WK [place] AND TRANSVAGINAL OB US TECHNIQUE: Both transabdominal and transvaginal ultrasound examinations were performed for complete evaluation of the gestation as well as the maternal uterus, adnexal regions, and pelvic cul-de-sac. Transvaginal technique was performed to assess early pregnancy. COMPARISON:  None for this pregnancy. Previous pelvic ultrasound 09/10/2015 FINDINGS: Intrauterine gestational sac: Single Yolk sac:  Present Embryo:  Not visualized Cardiac Activity: Not visualized MSD: 12.1  mm   6 w   0  d             [place] EDC: 03/05/2016 Subchorionic hemorrhage: A small subchorionic  hemorrhage is present along the left side of the gestational sac. Maternal uterus/adnexae: A prominent subserosal fibroid is present on the right side of the uterus measuring 4.3 x 5.4 x 4.1 cm. The ovaries are within normal limits bilaterally. The corpus luteal cyst is noted on the left. IMPRESSION: 1. Normal appearance of the single intrauterine gestational sac with a mean sac diameter of 12.1 mm, corresponding to an estimated gestational age of [redacted] weeks and 0 days. 2. Although the yolk sac is present, no embryo or cardiac activity is visualized at this point, within normal limits. 3. Small subchorionic hemorrhage may account for the vaginal bleeding. 4. Prominent right-sided subserosal fibroid measuring up to 5.4 cm. Electronically Signed   By: San Morelle M.D.   On: 07/10/2016 16:29    MDM +UPT UA, wet prep, GC/chlamydia, CBC, ABO/Rh, quant hCG, HIV, and [place] today to rule out ectopic pregnancy O positive Ultrasound shows IUGS with yolk sac & uterine fibroid Discussed results with patient. At time of discharge patient complains of 10/10 headache & severe nausea. Patient states she has had these symptoms since this morning but failed to mention them earlier in the visit. Headache cocktail given via IV (decadron, reglan, benadryl) & patient reports resolution in symptoms.  Assessment and Plan  A: 1. Normal IUP (intrauterine pregnancy) on prenatal ultrasound, first trimester   2. Vaginal bleeding in pregnancy, first trimester   3. Subserous leiomyoma of uterus   4. Pregnancy headache in first trimester   5. Nausea and vomiting during pregnancy prior to [redacted] weeks gestation    P:  Discharge home Rx phenergan Discussed reasons to return to [facility]U Start prenatal care GC/CT pending   Jorje Guild 07/10/2016, 12:03 PM

## 2016-07-10 NOTE — Progress Notes (Signed)
Patient concerned because prior to finding out she is pregnant used [place] and did drink alcohol as well as used Ibuprofen for headaches.

## 2016-07-10 NOTE — Discharge Instructions (Signed)
Morning Sickness Morning sickness is when you feel sick to your stomach (nauseous) during pregnancy. This nauseous feeling may or may not come with vomiting. It often occurs in the morning but can be a problem any time of day. Morning sickness is most common during the first trimester, but it may continue throughout pregnancy. While morning sickness is unpleasant, it is usually harmless unless you develop severe and continual vomiting (hyperemesis gravidarum). This condition requires more intense treatment. What are the causes? The cause of morning sickness is not completely known but seems to be related to normal hormonal changes that occur in pregnancy. What increases the risk? You are at greater risk if you:  Experienced nausea or vomiting before your pregnancy.  Had morning sickness during a previous pregnancy.  Are pregnant with more than one baby, such as twins. How is this treated? Do not use any medicines (prescription, over-the-counter, or herbal) for morning sickness without first talking to your health care provider. Your health care provider may prescribe or recommend:  Vitamin B6 supplements.  Anti-nausea medicines.  The herbal medicine ginger. Follow these instructions at home:  Only take over-the-counter or prescription medicines as directed by your health care provider.  Taking multivitamins before getting pregnant can prevent or decrease the severity of morning sickness in most women.  Eat a piece of dry toast or unsalted crackers before getting out of bed in the morning.  Eat five or six small meals a day.  Eat dry and bland foods (rice, baked potato). Foods high in carbohydrates are often helpful.  Do not drink liquids with your meals. Drink liquids between meals.  Avoid greasy, fatty, and spicy foods.  Get someone to cook for you if the smell of any food causes nausea and vomiting.  If you feel nauseous after taking prenatal vitamins, take the vitamins at  night or with a snack.  Snack on protein foods (nuts, yogurt, cheese) between meals if you are hungry.  Eat unsweetened gelatins for desserts.  Wearing an acupressure wristband (worn for sea sickness) may be helpful.  Acupuncture may be helpful.  Do not smoke.  Get a humidifier to keep the air in your house free of odors.  Get plenty of fresh air. Contact a health care provider if:  Your home remedies are not working, and you need medicine.  You feel dizzy or lightheaded.  You are losing weight. Get help right away if:  You have persistent and uncontrolled nausea and vomiting.  You pass out (faint). This information is not intended to replace advice given to you by your health care provider. Make sure you discuss any questions you have with your health care provider. Document Released: 04/02/2006 Document Revised: 07/18/2015 Document Reviewed: 07/27/2012 Elsevier Interactive Patient Education  2017 [employer] Vaginal Bleeding During Pregnancy, First Trimester A small amount of bleeding (spotting) from the vagina is relatively common in early pregnancy. It usually stops on its own. Various things may cause bleeding or spotting in early pregnancy. Some bleeding may be related to the pregnancy, and some may not. In most cases, the bleeding is normal and is not a problem. However, bleeding can also be a sign of something serious. Be sure to tell your health care provider about any vaginal bleeding right away. Some possible causes of vaginal bleeding during the first trimester include:  Infection or inflammation of the cervix.  Growths (polyps) on the cervix.  Miscarriage or threatened miscarriage.  Pregnancy tissue has developed outside of the uterus and  in a fallopian tube (tubal pregnancy).  Tiny cysts have developed in the uterus instead of pregnancy tissue (molar pregnancy). Follow these instructions at home: Watch your condition for any changes. The following actions  may help to lessen any discomfort you are feeling:  Follow your health care provider's instructions for limiting your activity. If your health care provider orders bed rest, you may need to stay in bed and only get up to use the bathroom. However, your health care provider may allow you to continue light activity.  If needed, make plans for someone to help with your regular activities and responsibilities while you are on bed rest.  Keep track of the number of pads you use each day, how often you change pads, and how soaked (saturated) they are. Write this down.  Do not use tampons. Do not douche.  Do not have sexual intercourse or orgasms until approved by your health care provider.  If you pass any tissue from your vagina, save the tissue so you can show it to your health care provider.  Only take over-the-counter or prescription medicines as directed by your health care provider.  Do not take aspirin because it can make you bleed.  Keep all follow-up appointments as directed by your health care provider. Contact a health care provider if:  You have any vaginal bleeding during any part of your pregnancy.  You have cramps or labor pains.  You have a fever, not controlled by medicine. Get help right away if:  You have severe cramps in your back or belly (abdomen).  You pass large clots or tissue from your vagina.  Your bleeding increases.  You feel light-headed or weak, or you have fainting episodes.  You have chills.  You are leaking fluid or have a gush of fluid from your vagina.  You pass out while having a bowel movement. This information is not intended to replace advice given to you by your health care provider. Make sure you discuss any questions you have with your health care provider. Document Released: 11/19/2004 Document Revised: 07/18/2015 Document Reviewed: 10/17/2012 Elsevier Interactive Patient Education  2017 [employer].

## 2016-07-10 NOTE — MAU Note (Signed)
preg confirmed on Monday at [facility].  Started bleeding this morning, was reddish, at first, now is brown. Had spotting on Tues also Denies any cramping. Has had nausea and vomiting.  Having headaches

## 2016-07-13 LAB — GC/CHLAMYDIA PROBE AMP (~~LOC~~) NOT AT ARMC
Chlamydia: NEGATIVE
Neisseria Gonorrhea: NEGATIVE

## 2016-07-21 ENCOUNTER — Ambulatory Visit: Payer: 59 | Admitting: *Deleted

## 2016-07-21 ENCOUNTER — Encounter: Payer: Self-pay | Admitting: General Practice

## 2016-07-21 ENCOUNTER — Ambulatory Visit (HOSPITAL_COMMUNITY)
Admission: RE | Admit: 2016-07-21 | Discharge: 2016-07-21 | Disposition: A | Discharge status: Home / Self Care | Payer: 59 | Source: Ambulatory Visit | Attending: Student | Admitting: Student

## 2016-07-21 DIAGNOSIS — O3411 Maternal care for benign tumor of corpus uteri, first trimester: Secondary | ICD-10-CM | POA: Insufficient documentation

## 2016-07-21 DIAGNOSIS — O209 Hemorrhage in early pregnancy, unspecified: Secondary | ICD-10-CM | POA: Diagnosis present

## 2016-07-21 DIAGNOSIS — Z3A01 Less than 8 weeks gestation of pregnancy: Secondary | ICD-10-CM | POA: Insufficient documentation

## 2016-07-21 DIAGNOSIS — D252 Subserosal leiomyoma of uterus: Secondary | ICD-10-CM | POA: Insufficient documentation

## 2016-07-21 DIAGNOSIS — Z712 Person consulting for explanation of examination or test findings: Secondary | ICD-10-CM

## 2016-07-21 DIAGNOSIS — Z3491 Encounter for supervision of normal pregnancy, unspecified, first trimester: Secondary | ICD-10-CM

## 2016-07-21 NOTE — Progress Notes (Signed)
Pt and significant other informed of [place] results showing viable pregnancy and normal FHR.  Picture from [place] dept given to pt. New Ob appt is scheduled on 6/19 @ [place].

## 2016-07-23 ENCOUNTER — Ambulatory Visit (HOSPITAL_COMMUNITY): Payer: 59

## 2016-08-11 ENCOUNTER — Ambulatory Visit (INDEPENDENT_AMBULATORY_CARE_PROVIDER_SITE_OTHER): Payer: 59 | Admitting: Certified Nurse Midwife

## 2016-08-11 ENCOUNTER — Encounter: Payer: Self-pay | Admitting: Certified Nurse Midwife

## 2016-08-11 ENCOUNTER — Encounter: Payer: Self-pay | Admitting: *Deleted

## 2016-08-11 VITALS — BP 134/88 | HR 112 | Wt 231.6 lb

## 2016-08-11 DIAGNOSIS — Z3400 Encounter for supervision of normal first pregnancy, unspecified trimester: Secondary | ICD-10-CM

## 2016-08-11 DIAGNOSIS — B373 Candidiasis of vulva and vagina: Secondary | ICD-10-CM

## 2016-08-11 DIAGNOSIS — Z3401 Encounter for supervision of normal first pregnancy, first trimester: Secondary | ICD-10-CM

## 2016-08-11 DIAGNOSIS — B3731 Acute candidiasis of vulva and vagina: Secondary | ICD-10-CM

## 2016-08-11 DIAGNOSIS — Z113 Encounter for screening for infections with a predominantly sexual mode of transmission: Secondary | ICD-10-CM

## 2016-08-11 DIAGNOSIS — D252 Subserosal leiomyoma of uterus: Secondary | ICD-10-CM

## 2016-08-11 DIAGNOSIS — O219 Vomiting of pregnancy, unspecified: Secondary | ICD-10-CM

## 2016-08-11 DIAGNOSIS — N61 Mastitis without abscess: Secondary | ICD-10-CM

## 2016-08-11 DIAGNOSIS — Z124 Encounter for screening for malignant neoplasm of cervix: Secondary | ICD-10-CM | POA: Diagnosis not present

## 2016-08-11 DIAGNOSIS — R55 Syncope and collapse: Secondary | ICD-10-CM

## 2016-08-11 MED ORDER — NYSTATIN-TRIAMCINOLONE 100000-0.1 UNIT/GM-% EX OINT: 1.0000 "application " | TOPICAL_OINTMENT | Freq: Two times a day (BID) | CUTANEOUS | 0 refills | Status: DC

## 2016-08-11 MED ORDER — OB COMPLETE PETITE 35-5-1-200 MG PO CAPS: 1.0000 | ORAL_CAPSULE | Freq: Every day | ORAL | 12 refills | Status: DC

## 2016-08-11 MED ORDER — DOXYLAMINE-PYRIDOXINE 10-10 MG PO TBEC: DELAYED_RELEASE_TABLET | ORAL | 4 refills | Status: DC

## 2016-08-11 MED ORDER — TERCONAZOLE 0.8 % VA CREA: 1.0000 | TOPICAL_CREAM | Freq: Every day | VAGINAL | 0 refills | Status: DC

## 2016-08-11 NOTE — Progress Notes (Signed)
Subjective:    Tiffany Sullivan is being seen today for her first obstetrical visit.  This is a planned pregnancy. She is at [redacted]w[redacted]d gestation. Her obstetrical history is significant for obesity. Relationship with FOB: significant other, living together. Patient does intend to breast feed. Pregnancy history fully reviewed.  The information documented in the HPI was reviewed and verified.  Menstrual History: OB History    Gravida Para Term Preterm AB Living   1 0           SAB TAB Ectopic Multiple Live Births                   Patient's last menstrual period was 06/02/2016.    Past Medical History:  Diagnosis Date  . Allergy   . Anemia   . Fibroid    1 small located on top of ovary  . GERD (gastroesophageal reflux disease)   . Syncope and collapse     History reviewed. No pertinent surgical history.   (Not in a hospital admission) No Known Allergies  Social History  Substance Use Topics  . Smoking status: Never Smoker  . Smokeless tobacco: Never Used  . Alcohol use 0.0 oz/week     Comment: none since Monday    Family History  Problem Relation Age of Onset  . Hypertension Mother      Review of Systems Constitutional: negative for weight loss Gastrointestinal: negative for vomiting Genitourinary:negative for genital lesions and vaginal discharge and dysuria Musculoskeletal:negative for back pain Behavioral/Psych: negative for abusive relationship, depression, illegal drug usage and tobacco use    Objective:    BP 134/88   Pulse (!) 112   Wt 231 lb 9.6 oz (105.1 kg)   LMP 06/02/2016   BMI 34.70 kg/m  General Appearance:    Alert, cooperative, no distress, appears stated age  Head:    Normocephalic, without obvious abnormality, atraumatic  Eyes:    PERRL, conjunctiva/corneas clear, EOM's intact, fundi    benign, both eyes  Ears:    Normal TM's and external ear canals, both ears  Nose:   Nares normal, septum midline, mucosa normal, no drainage    or sinus tenderness   Throat:   Lips, mucosa, and tongue normal; teeth and gums normal  Neck:   Supple, symmetrical, trachea midline, no adenopathy;    thyroid:  no enlargement/tenderness/nodules; no carotid   bruit or JVD  Back:     Symmetric, no curvature, ROM normal, no CVA tenderness  Lungs:     Clear to auscultation bilaterally, respirations unlabored  Chest Wall:    No tenderness or deformity   Heart:    Regular rate and rhythm, S1 and S2 normal, no murmur, rub   or gallop  Breast Exam:    No tenderness, masses, or nipple abnormality  Abdomen:     Soft, non-tender, bowel sounds active all four quadrants,    no masses, no organomegaly  Genitalia:    Normal female without lesion, discharge or tenderness  Extremities:   Extremities normal, atraumatic, no cyanosis or edema  Pulses:   2+ and symmetric all extremities  Skin:   Skin color, texture, turgor normal, no rashes or lesions  Lymph nodes:   Cervical, supraclavicular, and axillary nodes normal  Neurologic:   CNII-XII intact, normal strength, sensation and reflexes    throughout           Cervix:  Long, thick, closed and posterior.  FH: less than symphysis pubis.  FHR: seen with [place]    Lab Review Urine pregnancy test Labs reviewed yes Radiologic studies reviewed yes  Assessment & Plan    Pregnancy at [redacted]w[redacted]d weeks    1. Syncope and collapse     Nothing since she was 26 years old  2. Subserous leiomyoma of uterus     On TUVS  3. Encounter for supervision of normal first pregnancy in first trimester     - Cytology - PAP - Cervicovaginal ancillary only - Hemoglobinopathy evaluation - Varicella zoster antibody, IgG - Culture, OB Urine - Hemoglobin A1c - Obstetric Panel, Including HIV - Cystic Fibrosis Mutation 97 - MaterniT21 PLUS Core+SCA - Prenat-FeCbn-FeAspGl-FA-Omega (OB COMPLETE PETITE) 35-5-1-200 MG CAPS; Take 1 tablet by mouth daily.  Dispense: 30 capsule; Refill: 12  4. Yeast infection involving the  vagina and surrounding area     - terconazole (TERAZOL 3) 0.8 % vaginal cream; Place 1 applicator vaginally at bedtime.  Dispense: 20 g; Refill: 0  5. Infection of right breast    Fungal in origin - nystatin-triamcinolone ointment (MYCOLOG); Apply 1 application topically 2 (two) times daily. To the right breast.  Dispense: 60 g; Refill: 0  6. Nausea and vomiting during pregnancy prior to [redacted] weeks gestation     Has tried OTC  Medications and Phenergan - Doxylamine-Pyridoxine (DICLEGIS) 10-10 MG TBEC; Take 1 tablet with breakfast and lunch.  Take 2 tablets at bedtime.  Dispense: 100 tablet; Refill: 4  7. Supervision of normal first pregnancy, antepartum          Prenatal vitamins.  Counseling provided regarding continued use of seat belts, cessation of alcohol consumption, smoking or use of illicit drugs; infection precautions i.e., influenza/TDAP immunizations, toxoplasmosis,CMV, parvovirus, listeria and varicella; workplace safety, exercise during pregnancy; routine dental care, safe medications, sexual activity, hot tubs, saunas, pools, travel, caffeine use, fish and methlymercury, potential toxins, hair treatments, varicose veins Weight gain recommendations per IOM guidelines reviewed: underweight/BMI< 18.5--> gain 28 - 40 lbs; normal weight/BMI 18.5 - 24.9--> gain 25 - 35 lbs; overweight/BMI 25 - 29.9--> gain 15 - 25 lbs; obese/BMI >30->gain  11 - 20 lbs Problem list reviewed and updated. FIRST/CF mutation testing/NIPT/QUAD SCREEN/fragile X/Ashkenazi Jewish population testing/Spinal muscular atrophy discussed: ordered. Role of ultrasound in pregnancy discussed; fetal survey: requested. Amniocentesis discussed: not indicated.  Meds ordered this encounter  Medications  . terconazole (TERAZOL 3) 0.8 % vaginal cream    Sig: Place 1 applicator vaginally at bedtime.    Dispense:  20 g    Refill:  0  . nystatin-triamcinolone ointment (MYCOLOG)    Sig: Apply 1 application topically 2 (two)  times daily. To the right breast.    Dispense:  60 g    Refill:  0  . Doxylamine-Pyridoxine (DICLEGIS) 10-10 MG TBEC    Sig: Take 1 tablet with breakfast and lunch.  Take 2 tablets at bedtime.    Dispense:  100 tablet    Refill:  4  . Prenat-FeCbn-FeAspGl-FA-Omega (OB COMPLETE PETITE) 35-5-1-200 MG CAPS    Sig: Take 1 tablet by mouth daily.    Dispense:  30 capsule    Refill:  12   Orders Placed This Encounter  Procedures  . Culture, OB Urine  . Hemoglobinopathy evaluation  . Varicella zoster antibody, IgG  . Hemoglobin A1c  . Obstetric Panel, Including HIV  . Cystic Fibrosis Mutation 97  . MaterniT21 PLUS Core+SCA    Order Specific Question:   Is the patient insulin dependent?    Answer:  No    Order Specific Question:   Please enter gestational age. This should be expressed as weeks AND days, i.e. 16w 6d. Enter weeks here. Enter days in next question.    Answer:   69    Order Specific Question:   Please enter gestational age. This should be expressed as weeks AND days, i.e. 16w 6d. Enter days here. Enter weeks in previous question.    Answer:   0    Order Specific Question:   How was gestational age calculated?    Answer:   Ultrasound    Order Specific Question:   Please give the date of LMP OR Ultrasound OR Estimated date of delivery.    Answer:   03/09/2016    Order Specific Question:   Number of Fetuses (Type of Pregnancy):    Answer:   1    Order Specific Question:   Indications for performing the test? (please choose all that apply):    Answer:   Routine screening    Order Specific Question:   Other Indications? (Y=Yes, N=No)    Answer:   N    Order Specific Question:   If this is a repeat specimen, please indicate the reason:    Answer:   Not indicated    Order Specific Question:   Please specify the patient's race: (C=White/Caucasion, B=Black, I=Native American, A=Asian, H=Hispanic, O=Other, U=Unknown)    Answer:   B    Order Specific Question:   Donor Egg - indicate  if the egg was obtained from in vitro fertilization.    Answer:   N    Order Specific Question:   Age of Egg Donor.    Answer:   22    Order Specific Question:   Prior Down Syndrome/ONTD screening during current pregnancy.    Answer:   N    Order Specific Question:   Prior First Trimester Testing    Answer:   N    Order Specific Question:   Prior Second Trimester Testing    Answer:   N    Order Specific Question:   Family History of Neural Tube Defects    Answer:   N    Order Specific Question:   Prior Pregnancy with Down Syndrome    Answer:   N    Order Specific Question:   Please give the patient's weight (in pounds)    Answer:   232    Follow up in 4 weeks. 50% of 30 min visit spent on counseling and coordination of care.

## 2016-08-11 NOTE — Progress Notes (Signed)
Nausea and vomiting

## 2016-08-12 LAB — CERVICOVAGINAL ANCILLARY ONLY
BACTERIAL VAGINITIS: NEGATIVE
Candida vaginitis: NEGATIVE
Chlamydia: NEGATIVE
NEISSERIA GONORRHEA: NEGATIVE
TRICH (WINDOWPATH): NEGATIVE

## 2016-08-13 LAB — CYTOLOGY - PAP: DIAGNOSIS: NEGATIVE

## 2016-08-14 ENCOUNTER — Other Ambulatory Visit: Payer: Self-pay | Admitting: Certified Nurse Midwife

## 2016-08-14 DIAGNOSIS — Z3400 Encounter for supervision of normal first pregnancy, unspecified trimester: Secondary | ICD-10-CM

## 2016-08-17 LAB — CULTURE, OB URINE

## 2016-08-17 LAB — URINE CULTURE, OB REFLEX

## 2016-08-18 ENCOUNTER — Other Ambulatory Visit: Payer: Self-pay | Admitting: Certified Nurse Midwife

## 2016-08-18 DIAGNOSIS — Z3400 Encounter for supervision of normal first pregnancy, unspecified trimester: Secondary | ICD-10-CM

## 2016-08-18 LAB — HEMOGLOBINOPATHY EVALUATION
HEMOGLOBIN A2 QUANTITATION: 2.4 % (ref 1.8–3.2)
HGB C: 0 %
HGB S: 0 %
HGB VARIANT: 0 %
Hemoglobin F Quantitation: 0 % (ref 0.0–2.0)
Hgb A: 97.6 % (ref 96.4–98.8)

## 2016-08-18 LAB — OBSTETRIC PANEL, INCLUDING HIV
Antibody Screen: NEGATIVE
BASOS ABS: 0 10*3/uL (ref 0.0–0.2)
Basos: 0 %
EOS (ABSOLUTE): 0.2 10*3/uL (ref 0.0–0.4)
Eos: 2 %
HIV SCREEN 4TH GENERATION: NONREACTIVE
Hematocrit: 37 % (ref 34.0–46.6)
Hemoglobin: 11.8 g/dL (ref 11.1–15.9)
Hepatitis B Surface Ag: NEGATIVE
IMMATURE GRANULOCYTES: 0 %
Immature Grans (Abs): 0 10*3/uL (ref 0.0–0.1)
LYMPHS ABS: 2.2 10*3/uL (ref 0.7–3.1)
Lymphs: 19 %
MCH: 25 pg — AB (ref 26.6–33.0)
MCHC: 31.9 g/dL (ref 31.5–35.7)
MCV: 78 fL — ABNORMAL LOW (ref 79–97)
MONOS ABS: 0.4 10*3/uL (ref 0.1–0.9)
Monocytes: 4 %
NEUTROS ABS: 8.8 10*3/uL — AB (ref 1.4–7.0)
NEUTROS PCT: 75 %
PLATELETS: 283 10*3/uL (ref 150–379)
RBC: 4.72 x10E6/uL (ref 3.77–5.28)
RDW: 15.8 % — AB (ref 12.3–15.4)
RPR Ser Ql: NONREACTIVE
Rh Factor: POSITIVE
Rubella Antibodies, IGG: 3.54 index (ref >0.99)
WBC: 11.7 10*3/uL — AB (ref 3.4–10.8)

## 2016-08-18 LAB — MATERNIT21 PLUS CORE+SCA
CHROMOSOME 13: NEGATIVE
CHROMOSOME 21: NEGATIVE
Chromosome 18: NEGATIVE
Y Chromosome: DETECTED

## 2016-08-18 LAB — CYSTIC FIBROSIS MUTATION 97: Interpretation: NOT DETECTED

## 2016-08-18 LAB — HEMOGLOBIN A1C
Est. average glucose Bld gHb Est-mCnc: 91 mg/dL
Hgb A1c MFr Bld: 4.8 % (ref 4.8–5.6)

## 2016-08-18 LAB — VARICELLA ZOSTER ANTIBODY, IGG: VARICELLA: 2783 {index} (ref >165)

## 2016-09-09 ENCOUNTER — Encounter: Payer: Self-pay | Admitting: *Deleted

## 2016-09-09 ENCOUNTER — Ambulatory Visit (INDEPENDENT_AMBULATORY_CARE_PROVIDER_SITE_OTHER): Payer: 59 | Admitting: Certified Nurse Midwife

## 2016-09-09 VITALS — Wt 234.4 lb

## 2016-09-09 DIAGNOSIS — O99212 Obesity complicating pregnancy, second trimester: Secondary | ICD-10-CM

## 2016-09-09 DIAGNOSIS — E6609 Other obesity due to excess calories: Secondary | ICD-10-CM

## 2016-09-09 DIAGNOSIS — Z3400 Encounter for supervision of normal first pregnancy, unspecified trimester: Secondary | ICD-10-CM

## 2016-09-09 DIAGNOSIS — Z6834 Body mass index (BMI) 34.0-34.9, adult: Secondary | ICD-10-CM

## 2016-09-09 DIAGNOSIS — Z3402 Encounter for supervision of normal first pregnancy, second trimester: Secondary | ICD-10-CM

## 2016-09-09 DIAGNOSIS — L0293 Carbuncle, unspecified: Secondary | ICD-10-CM

## 2016-09-09 DIAGNOSIS — O219 Vomiting of pregnancy, unspecified: Secondary | ICD-10-CM

## 2016-09-09 MED ORDER — DOXYLAMINE-PYRIDOXINE ER 20-20 MG PO TBCR: 1.0000 | EXTENDED_RELEASE_TABLET | Freq: Two times a day (BID) | ORAL | 6 refills | Status: DC

## 2016-09-09 NOTE — Progress Notes (Signed)
   PRENATAL VISIT NOTE  Subjective:  Tiffany Sullivan is a 26 y.o. G1P0 at [redacted]w[redacted]d being seen today for ongoing prenatal care.  She is currently monitored for the following issues for this low-risk pregnancy and has Obesity; Hidradenitis; Uterine fibroid; Syncope and collapse; and Supervision of normal first pregnancy, antepartum on her problem list.  Patient reports nausea, no bleeding, no contractions, no cramping, no leaking, vomiting and recurrent boils.  Contractions: Not present. Vag. Bleeding: None.   . Denies leaking of fluid.   The following portions of the patient's history were reviewed and updated as appropriate: allergies, current medications, past family history, past medical history, past social history, past surgical history and problem list. Problem list updated.  Objective:   Vitals:   09/09/16 1614  Weight: 234 lb 6.4 oz (106.3 kg)    Fetal Status: Fetal Heart Rate (bpm): 154; doppler         General:  Alert, oriented and cooperative. Patient is in no acute distress.  Skin: Skin is warm and dry. No rash noted.   Cardiovascular: Normal heart rate noted  Respiratory: Normal respiratory effort, no problems with respiration noted  Abdomen: Soft, gravid, appropriate for gestational age.  Pain/Pressure: Absent     Pelvic: Cervical exam deferred        Extremities: Normal range of motion.  Edema: None  Mental Status:  Normal mood and affect. Normal behavior. Normal judgment and thought content.   Assessment and Plan:  Pregnancy: G1P0 at [redacted]w[redacted]d  1. Supervision of normal first pregnancy, antepartum     Doing ok, N&V diet discussed.  - [place] MFM OB DETAIL +14 WK; Future  2. Recurrent boils     OTC neosporin, Epson salt baths - Ambulatory referral to Dermatology  3. Nausea and vomiting during pregnancy prior to [redacted] weeks gestation     States that Diclegis was working, does not have anymore.  - Doxylamine-Pyridoxine ER (BONJESTA) 20-20 MG TBCR; Take 1 tablet by mouth 2 (two)  times daily.  Dispense: 60 tablet; Refill: 6  Preterm labor symptoms and general obstetric precautions including but not limited to vaginal bleeding, contractions, leaking of fluid and fetal movement were reviewed in detail with the patient. Please refer to After Visit Summary for other counseling recommendations.  Return in about 4 weeks (around 10/07/2016) for [employer].   Morene Crocker, CNM

## 2016-09-09 NOTE — Progress Notes (Signed)
Patient is in the office for routine ob visit, no complaints.

## 2016-09-16 ENCOUNTER — Ambulatory Visit (INDEPENDENT_AMBULATORY_CARE_PROVIDER_SITE_OTHER): Payer: 59 | Admitting: Obstetrics

## 2016-09-16 ENCOUNTER — Encounter: Payer: Self-pay | Admitting: Obstetrics

## 2016-09-16 ENCOUNTER — Telehealth: Payer: Self-pay

## 2016-09-16 VITALS — BP 130/70 | HR 114 | Wt 233.0 lb

## 2016-09-16 DIAGNOSIS — N898 Other specified noninflammatory disorders of vagina: Secondary | ICD-10-CM | POA: Diagnosis not present

## 2016-09-16 DIAGNOSIS — Z3400 Encounter for supervision of normal first pregnancy, unspecified trimester: Secondary | ICD-10-CM

## 2016-09-16 DIAGNOSIS — Z3402 Encounter for supervision of normal first pregnancy, second trimester: Secondary | ICD-10-CM

## 2016-09-16 LAB — POCT URINALYSIS DIPSTICK
Bilirubin, UA: NEGATIVE
Blood, UA: NEGATIVE
GLUCOSE UA: NEGATIVE
KETONES UA: 1
Nitrite, UA: NEGATIVE
UROBILINOGEN UA: NEGATIVE U/dL — AB
pH, UA: 6.5 (ref 5.0–8.0)

## 2016-09-16 NOTE — Progress Notes (Signed)
Patient reports think mucus discharge for about 1 week. Patient reports no other symptoms- no cramping.

## 2016-09-16 NOTE — Progress Notes (Signed)
Subjective:  Tiffany Sullivan is a 26 y.o. G1P0 at [redacted]w[redacted]d being seen today for ongoing prenatal care.  She is currently monitored for the following issues for this low-risk pregnancy and has Obesity (BMI 30.0-34.9); Hidradenitis; Uterine fibroid; Syncope and collapse; and Supervision of normal first pregnancy, antepartum on her problem list.  Patient reports thick vaginal discharge.  Denies vaginal itching or odor.  Contractions: Not present. Vag. Bleeding: None.   . Denies leaking of fluid.   The following portions of the patient's history were reviewed and updated as appropriate: allergies, current medications, past family history, past medical history, past social history, past surgical history and problem list. Problem list updated.  Objective:   Vitals:   09/16/16 1441 09/16/16 1505  BP: (!) 142/86 130/70  Pulse: (!) 114   Weight: 233 lb (105.7 kg)     Fetal Status:           General:  Alert, oriented and cooperative. Patient is in no acute distress.  Skin: Skin is warm and dry. No rash noted.   Cardiovascular: Normal heart rate noted  Respiratory: Normal respiratory effort, no problems with respiration noted  Abdomen: Soft, gravid, appropriate for gestational age. Pain/Pressure: Absent     Pelvic:    Cervix appeared long and closed on SSE  Extremities: Normal range of motion.  Edema: None  Mental Status: Normal mood and affect. Normal behavior. Normal judgment and thought content.   Urinalysis:      Assessment and Plan:  Pregnancy: G1P0 at [redacted]w[redacted]d  1. Supervision of normal first pregnancy, antepartum Rx: - POCT urinalysis dipstick - [place] MFM OB COMP + 14 WK; Future  2. Vaginal discharge Rx; - Cervicovaginal ancillary only   Preterm labor symptoms and general obstetric precautions including but not limited to vaginal bleeding, contractions, leaking of fluid and fetal movement were reviewed in detail with the patient. Please refer to After Visit Summary for other counseling  recommendations.  Return in about 4 weeks (around 10/14/2016) for [facility].   Shelly Bombard, MDPatient ID: Tiffany Sullivan, female   DOB: [birth year], 26 y.o.   MRN: [id number]

## 2016-09-16 NOTE — Telephone Encounter (Signed)
Patient called in and stated that she has been having a thick yellow discharge for a few days. Reports discomfort when she urinates, denies cramping and bleeding. Contacted scheduler for pt to make an appt.

## 2016-09-18 LAB — CERVICOVAGINAL ANCILLARY ONLY
Bacterial vaginitis: NEGATIVE
Candida vaginitis: NEGATIVE
Trichomonas: NEGATIVE

## 2016-09-21 ENCOUNTER — Other Ambulatory Visit: Payer: Self-pay | Admitting: *Deleted

## 2016-09-21 DIAGNOSIS — O219 Vomiting of pregnancy, unspecified: Secondary | ICD-10-CM

## 2016-09-21 MED ORDER — DICLEGIS 10-10 MG PO TBEC: 1.0000 | DELAYED_RELEASE_TABLET | Freq: Two times a day (BID) | ORAL | 99 refills | Status: DC

## 2016-09-29 ENCOUNTER — Telehealth: Payer: Self-pay | Admitting: *Deleted

## 2016-09-29 NOTE — Telephone Encounter (Signed)
Pt called to office LM stating she was having some pelvic pressure.  Attempt to return call.  No answer, LM on VM to call office if needed.

## 2016-09-30 ENCOUNTER — Encounter (HOSPITAL_COMMUNITY): Payer: Self-pay | Admitting: *Deleted

## 2016-09-30 ENCOUNTER — Telehealth: Payer: Self-pay | Admitting: *Deleted

## 2016-09-30 ENCOUNTER — Inpatient Hospital Stay (HOSPITAL_COMMUNITY)
Admission: AD | Admit: 2016-09-30 | Discharge: 2016-09-30 | Disposition: A | Discharge status: Home / Self Care | Payer: 59 | Source: Ambulatory Visit | Attending: Obstetrics and Gynecology | Admitting: Obstetrics and Gynecology

## 2016-09-30 DIAGNOSIS — Z8249 Family history of ischemic heart disease and other diseases of the circulatory system: Secondary | ICD-10-CM | POA: Diagnosis not present

## 2016-09-30 DIAGNOSIS — R109 Unspecified abdominal pain: Secondary | ICD-10-CM | POA: Insufficient documentation

## 2016-09-30 DIAGNOSIS — Z79899 Other long term (current) drug therapy: Secondary | ICD-10-CM | POA: Diagnosis not present

## 2016-09-30 DIAGNOSIS — Z3A17 17 weeks gestation of pregnancy: Secondary | ICD-10-CM | POA: Insufficient documentation

## 2016-09-30 DIAGNOSIS — O26892 Other specified pregnancy related conditions, second trimester: Secondary | ICD-10-CM | POA: Diagnosis not present

## 2016-09-30 DIAGNOSIS — Z3492 Encounter for supervision of normal pregnancy, unspecified, second trimester: Secondary | ICD-10-CM

## 2016-09-30 DIAGNOSIS — R102 Pelvic and perineal pain unspecified side: Secondary | ICD-10-CM

## 2016-09-30 DIAGNOSIS — K219 Gastro-esophageal reflux disease without esophagitis: Secondary | ICD-10-CM | POA: Diagnosis not present

## 2016-09-30 DIAGNOSIS — O26899 Other specified pregnancy related conditions, unspecified trimester: Secondary | ICD-10-CM

## 2016-09-30 LAB — URINALYSIS, ROUTINE W REFLEX MICROSCOPIC
Bilirubin Urine: NEGATIVE
GLUCOSE, UA: NEGATIVE mg/dL
Hgb urine dipstick: NEGATIVE
KETONES UR: NEGATIVE mg/dL
Leukocytes, UA: NEGATIVE
NITRITE: NEGATIVE
PROTEIN: NEGATIVE mg/dL
Specific Gravity, Urine: 1.017 (ref 1.005–1.030)
pH: 7 (ref 5.0–8.0)

## 2016-09-30 NOTE — Discharge Instructions (Signed)

## 2016-09-30 NOTE — MAU Note (Signed)
Pt C/O mid abd pressure near navel since Saturday, feels like "trapped gas."  Took a laxative Sunday & had a BM on Monday, but pressure is still the same.  Denies bleeding.  Has pressure & discomfort with urination as well.

## 2016-09-30 NOTE — Telephone Encounter (Signed)
Pt called to office stating that she has been feeling stomach pain/pressure since Saturday. Pt states feeling is "like trap gas pain/pressure" Pt states that she has tried a "mild laxative" with some relief. Pt denies constipation or urinary problems. Pt is not currently taking medication for GI upset.  Pt made aware message to be sent to provider for further recommendations.   Please adivse.

## 2016-09-30 NOTE — MAU Provider Note (Signed)
History     CSN: [id number]  Arrival date and time: 09/30/16 1412  None     Chief Complaint  Patient presents with  . Abdominal Pain   HPI Tiffany Sullivan is a 26 y.o. G1P0 at [redacted]w[redacted]d who presents with abdominal pain. Symptoms began Saturday. Describes as feeling like "trapped gas". Took colace on Sunday d/t constipation, had a BM Monday. Pain is intermittent in lower abdomen & sharp. Rates pain 6/10. Has not treated pain. Denies n/v, vaginal bleeding, LOF, or dysuria. No recent intercourse.   OB History    Gravida Para Term Preterm AB Living   1 0           SAB TAB Ectopic Multiple Live Births                  Past Medical History:  Diagnosis Date  . Allergy   . Anemia   . Fibroid    1 small located on top of ovary  . GERD (gastroesophageal reflux disease)   . Syncope and collapse     History reviewed. No pertinent surgical history.  Family History  Problem Relation Age of Onset  . Hypertension Mother     Social History  Substance Use Topics  . Smoking status: Never Smoker  . Smokeless tobacco: Never Used  . Alcohol use 0.0 oz/week     Comment: none since Monday    Allergies: No Known Allergies  Prescriptions Prior to Admission  Medication Sig Dispense Refill Last Dose  . DICLEGIS 10-10 MG TBEC Take 1 tablet by mouth 2 (two) times daily. 1 tab in AM, 1 tab mid afternoon 2 tabs at bedtime. Max dose 4 tabs daily. 100 tablet prn   . nystatin-triamcinolone ointment (MYCOLOG) Apply 1 application topically 2 (two) times daily. To the right breast. (Patient not taking: Reported on 09/09/2016) 60 g 0 Not Taking  . Prenat-FeCbn-FeAspGl-FA-Omega (OB COMPLETE PETITE) 35-5-1-200 MG CAPS Take 1 tablet by mouth daily. 30 capsule 12 Taking  . terconazole (TERAZOL 3) 0.8 % vaginal cream Place 1 applicator vaginally at bedtime. (Patient not taking: Reported on 09/09/2016) 20 g 0 Not Taking    Review of Systems  Constitutional: Negative.   Gastrointestinal: Positive for abdominal  pain and constipation. Negative for diarrhea, nausea and vomiting.  Genitourinary: Negative.    Physical Exam   Blood pressure 120/78, pulse 93, temperature 98.4 F (36.9 C), temperature source Oral, resp. rate 16, last menstrual period 06/02/2016.  Physical Exam  Nursing note and vitals reviewed. Constitutional: She is oriented to person, place, and time. She appears well-developed and well-nourished. No distress.  HENT:  Head: Normocephalic and atraumatic.  Eyes: Conjunctivae are normal. Right eye exhibits no discharge. Left eye exhibits no discharge. No scleral icterus.  Neck: Normal range of motion.  Cardiovascular: Normal rate, regular rhythm and normal heart sounds.   No murmur heard. Respiratory: Effort normal and breath sounds normal. No respiratory distress. She has no wheezes.  GI: Soft. Normal appearance and bowel sounds are normal. There is tenderness in the periumbilical area and suprapubic area. There is no rigidity and no guarding.  Neurological: She is alert and oriented to person, place, and time.  Skin: Skin is warm and dry. She is not diaphoretic.  Psychiatric: She has a normal mood and affect. Her behavior is normal. Judgment and thought content normal.   Dilation: Closed Effacement (%): Thick Cervical Position: Posterior Exam by:: Jorje Guild NP  MAU Course  Procedures Results for orders placed or  performed during the hospital encounter of 09/30/16 (from the past 24 hour(s))  Urinalysis, Routine w reflex microscopic     Status: Abnormal   Collection Time: 09/30/16  2:30 PM  Result Value Ref Range   Color, Urine YELLOW YELLOW   APPearance CLOUDY (A) CLEAR   Specific Gravity, Urine 1.017 1.005 - 1.030   pH 7.0 5.0 - 8.0   Glucose, UA NEGATIVE NEGATIVE mg/dL   Hgb urine dipstick NEGATIVE NEGATIVE   Bilirubin Urine NEGATIVE NEGATIVE   Ketones, ur NEGATIVE NEGATIVE mg/dL   Protein, ur NEGATIVE NEGATIVE mg/dL   Nitrite NEGATIVE NEGATIVE   Leukocytes, UA  NEGATIVE NEGATIVE    MDM FHT 158 Benign abdominal exam. Cervix closed/thick. Offered simethicone to patient; patient prefers to take at home   Assessment and Plan  A: 1. Abdominal pain affecting pregnancy   2. Pain of round ligament affecting pregnancy, antepartum   3. Fetal heart tones present, second trimester    P; Discharge home Continue colace prn constipation Take simethicone prn Discussed reasons to return to MAU Keep f/u with OB  Jorje Guild 09/30/2016, 5:13 PM

## 2016-10-01 ENCOUNTER — Encounter: Payer: Self-pay | Admitting: *Deleted

## 2016-10-01 ENCOUNTER — Other Ambulatory Visit: Payer: Self-pay | Admitting: Certified Nurse Midwife

## 2016-10-01 NOTE — Telephone Encounter (Signed)
Message sent to pt via mychart

## 2016-10-01 NOTE — Telephone Encounter (Signed)
Was seen at MAU, most likely chronic constipation related to early pregnancy.  Follow MAU recommendations. Colace BID OTC. Increase fluids and activity.  OTC tums for heart burn. Mylicon for gas pain OTC.  Blessings; R.Clancy Mullarkey CNM

## 2016-10-07 ENCOUNTER — Encounter: Payer: 59 | Admitting: Certified Nurse Midwife

## 2016-10-14 ENCOUNTER — Ambulatory Visit (INDEPENDENT_AMBULATORY_CARE_PROVIDER_SITE_OTHER): Payer: 59 | Admitting: Certified Nurse Midwife

## 2016-10-14 ENCOUNTER — Encounter: Payer: Self-pay | Admitting: *Deleted

## 2016-10-14 VITALS — BP 137/84 | HR 102 | Wt 236.1 lb

## 2016-10-14 DIAGNOSIS — Z3400 Encounter for supervision of normal first pregnancy, unspecified trimester: Secondary | ICD-10-CM

## 2016-10-14 DIAGNOSIS — E669 Obesity, unspecified: Secondary | ICD-10-CM

## 2016-10-14 DIAGNOSIS — Z3402 Encounter for supervision of normal first pregnancy, second trimester: Secondary | ICD-10-CM

## 2016-10-14 NOTE — Patient Instructions (Addendum)
How a Baby Grows During Pregnancy Pregnancy begins when a female's sperm enters a female's egg (fertilization). This happens in one of the tubes (fallopian tubes) that connect the ovaries to the womb (uterus). The fertilized egg is called an embryo until it reaches 10 weeks. From 10 weeks until birth, it is called a fetus. The fertilized egg moves down the fallopian tube to the uterus. Then it implants into the lining of the uterus and begins to grow. The developing fetus receives oxygen and nutrients through the pregnant woman's bloodstream and the tissues that grow (placenta) to support the fetus. The placenta is the life support system for the fetus. It provides nutrition and removes waste. Learning as much as you can about your pregnancy and how your baby is developing can help you enjoy the experience. It can also make you aware of when there might be a problem and when to ask questions. How long does a typical pregnancy last? A pregnancy usually lasts 280 days, or about 40 weeks. Pregnancy is divided into three trimesters:  First trimester: 0-13 weeks.  Second trimester: 14-27 weeks.  Third trimester: 28-40 weeks.  The day when your baby is considered ready to be born (full term) is your estimated date of delivery. How does my baby develop month by month? First month  The fertilized egg attaches to the inside of the uterus.  Some cells will form the placenta. Others will form the fetus.  The arms, legs, brain, spinal cord, lungs, and heart begin to develop.  At the end of the first month, the heart begins to beat.  Second month  The bones, inner ear, eyelids, hands, and feet form.  The genitals develop.  By the end of 8 weeks, all major organs are developing.  Third month  All of the internal organs are forming.  Teeth develop below the gums.  Bones and muscles begin to grow. The spine can flex.  The skin is transparent.  Fingernails and toenails begin to form.  Arms  and legs continue to grow longer, and hands and feet develop.  The fetus is about 3 in (7.6 cm) long.  Fourth month  The placenta is completely formed.  The external sex organs, neck, outer ear, eyebrows, eyelids, and fingernails are formed.  The fetus can hear, swallow, and move its arms and legs.  The kidneys begin to produce urine.  The skin is covered with a white waxy coating (vernix) and very fine hair (lanugo).  Fifth month  The fetus moves around more and can be felt for the first time (quickening).  The fetus starts to sleep and wake up and may begin to suck its finger.  The nails grow to the end of the fingers.  The organ in the digestive system that makes bile (gallbladder) functions and helps to digest the nutrients.  If your baby is a girl, eggs are present in her ovaries. If your baby is a boy, testicles start to move down into his scrotum.  Sixth month  The lungs are formed, but the fetus is not yet able to breathe.  The eyes open. The brain continues to develop.  Your baby has fingerprints and toe prints. Your baby's hair grows thicker.  At the end of the second trimester, the fetus is about 9 in (22.9 cm) long.  Seventh month  The fetus kicks and stretches.  The eyes are developed enough to sense changes in light.  The hands can make a grasping motion.  The  fetus responds to sound.  Eighth month  All organs and body systems are fully developed and functioning.  Bones harden and taste buds develop. The fetus may hiccup.  Certain areas of the brain are still developing. The skull remains soft.  Ninth month  The fetus gains about  lb (0.23 kg) each week.  The lungs are fully developed.  Patterns of sleep develop.  The fetus's head typically moves into a head-down position (vertex) in the uterus to prepare for birth. If the buttocks move into a vertex position instead, the baby is breech.  The fetus weighs 6-9 lbs (2.72-4.08 kg) and is  19-20 in (48.26-50.8 cm) long.  What can I do to have a healthy pregnancy and help my baby develop? Eating and Drinking  Eat a healthy diet. ? Talk with your health care provider to make sure that you are getting the nutrients that you and your baby need. ? Visit www.choosemyplate.gov to learn about creating a healthy diet.  Gain a healthy amount of weight during pregnancy as advised by your health care provider. This is usually 25-35 pounds. You may need to: ? Gain more if you were underweight before getting pregnant or if you are pregnant with more than one baby. ? Gain less if you were overweight or obese when you got pregnant.  Medicines and Vitamins  Take prenatal vitamins as directed by your health care provider. These include vitamins such as folic acid, iron, calcium, and vitamin D. They are important for healthy development.  Take medicines only as directed by your health care provider. Read labels and ask a pharmacist or your health care provider whether over-the-counter medicines, supplements, and prescription drugs are safe to take during pregnancy.  Activities  Be physically active as advised by your health care provider. Ask your health care provider to recommend activities that are safe for you to do, such as walking or swimming.  Do not participate in strenuous or extreme sports.  Lifestyle  Do not drink alcohol.  Do not use any tobacco products, including cigarettes, chewing tobacco, or electronic cigarettes. If you need help quitting, ask your health care provider.  Do not use illegal drugs.  Safety  Avoid exposure to mercury, lead, or other heavy metals. Ask your health care provider about common sources of these heavy metals.  Avoid listeria infection during pregnancy. Follow these precautions: ? Do not eat soft cheeses or deli meats. ? Do not eat hot dogs unless they have been warmed up to the point of steaming, such as in the microwave oven. ? Do not  drink unpasteurized milk.  Avoid toxoplasmosis infection during pregnancy. Follow these precautions: ? Do not change your cat's litter box, if you have a cat. Ask someone else to do this for you. ? Wear gardening gloves while working in the yard.  General Instructions  Keep all follow-up visits as directed by your health care provider. This is important. This includes prenatal care and screening tests.  Manage any chronic health conditions. Work closely with your health care provider to keep conditions, such as diabetes, under control.  How do I know if my baby is developing well? At each prenatal visit, your health care provider will do several different tests to check on your health and keep track of your baby's development. These include:  Fundal height. ? Your health care provider will measure your growing belly from top to bottom using a tape measure. ? Your health care provider will also feel your belly   to determine your baby's position.  Heartbeat. ? An ultrasound in the first trimester can confirm pregnancy and show a heartbeat, depending on how far along you are. ? Your health care provider will check your baby's heart rate at every prenatal visit. ? As you get closer to your delivery date, you may have regular fetal heart rate monitoring to make sure that your baby is not in distress.  Second trimester ultrasound. ? This ultrasound checks your baby's development. It also indicates your baby's gender.  What should I do if I have concerns about my baby's development? Always talk with your health care provider about any concerns that you may have. This information is not intended to replace advice given to you by your health care provider. Make sure you discuss any questions you have with your health care provider. Document Released: 07/29/2007 Document Revised: 07/18/2015 Document Reviewed: 07/19/2013 Elsevier Interactive Patient Education  2018 [place] of Pregnancy The second trimester is from week 14 through week 27 (months 4 through 6). The second trimester is often a time when you feel your best. Your body has adjusted to being pregnant, and you begin to feel better physically. Usually, morning sickness has lessened or quit completely, you may have more energy, and you may have an increase in appetite. The second trimester is also a time when the fetus is growing rapidly. At the end of the sixth month, the fetus is about 9 inches long and weighs about 1 pounds. You will likely begin to feel the baby move (quickening) between 16 and 20 weeks of pregnancy. Body changes during your second trimester Your body continues to go through many changes during your second trimester. The changes vary from woman to woman.  Your weight will continue to increase. You will notice your lower abdomen bulging out.  You may begin to get stretch marks on your hips, abdomen, and breasts.  You may develop headaches that can be relieved by medicines. The medicines should be approved by your health care provider.  You may urinate more often because the fetus is pressing on your bladder.  You may develop or continue to have heartburn as a result of your pregnancy.  You may develop constipation because certain hormones are causing the muscles that push waste through your intestines to slow down.  You may develop hemorrhoids or swollen, bulging veins (varicose veins).  You may have back pain. This is caused by: ? Weight gain. ? Pregnancy hormones that are relaxing the joints in your pelvis. ? A shift in weight and the muscles that support your balance.  Your breasts will continue to grow and they will continue to become tender.  Your gums may bleed and may be sensitive to brushing and flossing.  Dark spots or blotches (chloasma, mask of pregnancy) may develop on your face. This will likely fade after the baby is born.  A dark line from your belly  button to the pubic area (linea nigra) may appear. This will likely fade after the baby is born.  You may have changes in your hair. These can include thickening of your hair, rapid growth, and changes in texture. Some women also have hair loss during or after pregnancy, or hair that feels dry or thin. Your hair will most likely return to normal after your baby is born.  What to expect at prenatal visits During a routine prenatal visit:  You will be weighed to make sure you and the fetus are  growing normally.  Your blood pressure will be taken.  Your abdomen will be measured to track your baby's growth.  The fetal heartbeat will be listened to.  Any test results from the previous visit will be discussed.  Your health care provider may ask you:  How you are feeling.  If you are feeling the baby move.  If you have had any abnormal symptoms, such as leaking fluid, bleeding, severe headaches, or abdominal cramping.  If you are using any tobacco products, including cigarettes, chewing tobacco, and electronic cigarettes.  If you have any questions.  Other tests that may be performed during your second trimester include:  Blood tests that check for: ? Low iron levels (anemia). ? High blood sugar that affects pregnant women (gestational diabetes) between 64 and 28 weeks. ? Rh antibodies. This is to check for a protein on red blood cells (Rh factor).  Urine tests to check for infections, diabetes, or protein in the urine.  An ultrasound to confirm the proper growth and development of the baby.  An amniocentesis to check for possible genetic problems.  Fetal screens for spina bifida and Down syndrome.  HIV (human immunodeficiency virus) testing. Routine prenatal testing includes screening for HIV, unless you choose not to have this test.  Follow these instructions at home: Medicines  Follow your health care provider's instructions regarding medicine use. Specific medicines may  be either safe or unsafe to take during pregnancy.  Take a prenatal vitamin that contains at least 600 micrograms (mcg) of folic acid.  If you develop constipation, try taking a stool softener if your health care provider approves. Eating and drinking  Eat a balanced diet that includes fresh fruits and vegetables, whole grains, good sources of protein such as meat, eggs, or tofu, and low-fat dairy. Your health care provider will help you determine the amount of weight gain that is right for you.  Avoid raw meat and uncooked cheese. These carry germs that can cause birth defects in the baby.  If you have low calcium intake from food, talk to your health care provider about whether you should take a daily calcium supplement.  Limit foods that are high in fat and processed sugars, such as fried and sweet foods.  To prevent constipation: ? Drink enough fluid to keep your urine clear or pale yellow. ? Eat foods that are high in fiber, such as fresh fruits and vegetables, whole grains, and beans. Activity  Exercise only as directed by your health care provider. Most women can continue their usual exercise routine during pregnancy. Try to exercise for 30 minutes at least 5 days a week. Stop exercising if you experience uterine contractions.  Avoid heavy lifting, wear low heel shoes, and practice good posture.  A sexual relationship may be continued unless your health care provider directs you otherwise. Relieving pain and discomfort  Wear a good support bra to prevent discomfort from breast tenderness.  Take warm sitz baths to soothe any pain or discomfort caused by hemorrhoids. Use hemorrhoid cream if your health care provider approves.  Rest with your legs elevated if you have leg cramps or low back pain.  If you develop varicose veins, wear support hose. Elevate your feet for 15 minutes, 3-4 times a day. Limit salt in your diet. Prenatal Care  Write down your questions. Take them to  your prenatal visits.  Keep all your prenatal visits as told by your health care provider. This is important. Safety  Wear your seat belt  at all times when driving.  Make a list of emergency phone numbers, including numbers for family, friends, the hospital, and police and fire departments. General instructions  Ask your health care provider for a referral to a local prenatal education class. Begin classes no later than the beginning of month 6 of your pregnancy.  Ask for help if you have counseling or nutritional needs during pregnancy. Your health care provider can offer advice or refer you to specialists for help with various needs.  Do not use hot tubs, steam rooms, or saunas.  Do not douche or use tampons or scented sanitary pads.  Do not cross your legs for long periods of time.  Avoid cat litter boxes and soil used by cats. These carry germs that can cause birth defects in the baby and possibly loss of the fetus by miscarriage or stillbirth.  Avoid all smoking, herbs, alcohol, and unprescribed drugs. Chemicals in these products can affect the formation and growth of the baby.  Do not use any products that contain nicotine or tobacco, such as cigarettes and e-cigarettes. If you need help quitting, ask your health care provider.  Visit your dentist if you have not gone yet during your pregnancy. Use a soft toothbrush to brush your teeth and be gentle when you floss. Contact a health care provider if:  You have dizziness.  You have mild pelvic cramps, pelvic pressure, or nagging pain in the abdominal area.  You have persistent nausea, vomiting, or diarrhea.  You have a bad smelling vaginal discharge.  You have pain when you urinate. Get help right away if:  You have a fever.  You are leaking fluid from your vagina.  You have spotting or bleeding from your vagina.  You have severe abdominal cramping or pain.  You have rapid weight gain or weight loss.  You have  shortness of breath with chest pain.  You notice sudden or extreme swelling of your face, hands, ankles, feet, or legs.  You have not felt your baby move in over an hour.  You have severe headaches that do not go away when you take medicine.  You have vision changes. Summary  The second trimester is from week 14 through week 27 (months 4 through 6). It is also a time when the fetus is growing rapidly.  Your body goes through many changes during pregnancy. The changes vary from woman to woman.  Avoid all smoking, herbs, alcohol, and unprescribed drugs. These chemicals affect the formation and growth your baby.  Do not use any tobacco products, such as cigarettes, chewing tobacco, and e-cigarettes. If you need help quitting, ask your health care provider.  Contact your health care provider if you have any questions. Keep all prenatal visits as told by your health care provider. This is important. This information is not intended to replace advice given to you by your health care provider. Make sure you discuss any questions you have with your health care provider. Document Released: 02/03/2001 Document Revised: 07/18/2015 Document Reviewed: 04/12/2012 Elsevier Interactive Patient Education  2017 [place].  Prenatal Care WHAT IS PRENATAL CARE? Prenatal care is the process of caring for a pregnant woman before she gives birth. Prenatal care makes sure that she and her baby remain as healthy as possible throughout pregnancy. Prenatal care may be provided by a midwife, family practice health care provider, or a childbirth and pregnancy specialist (obstetrician). Prenatal care may include physical examinations, testing, treatments, and education on nutrition, lifestyle, and social  support services. WHY IS PRENATAL CARE SO IMPORTANT? Early and consistent prenatal care increases the chance that you and your baby will remain healthy throughout your pregnancy. This type of care also decreases a  baby's risk of being born too early (prematurely), or being born smaller than expected (small for gestational age). Any underlying medical conditions you may have that could pose a risk during your pregnancy are discussed during prenatal care visits. You will also be monitored regularly for any new conditions that may arise during your pregnancy so they can be treated quickly and effectively. WHAT HAPPENS DURING PRENATAL CARE VISITS? Prenatal care visits may include the following: Discussion Tell your health care provider about any new signs or symptoms you have experienced since your last visit. These might include:  Nausea or vomiting.  Increased or decreased level of energy.  Difficulty sleeping.  Back or leg pain.  Weight changes.  Frequent urination.  Shortness of breath with physical activity.  Changes in your skin, such as the development of a rash or itchiness.  Vaginal discharge or bleeding.  Feelings of excitement or nervousness.  Changes in your baby's movements.  You may want to write down any questions or topics you want to discuss with your health care provider and bring them with you to your appointment. Examination During your first prenatal care visit, you will likely have a complete physical exam. Your health care provider will often examine your vagina, cervix, and the position of your uterus, as well as check your heart, lungs, and other body systems. As your pregnancy progresses, your health care provider will measure the size of your uterus and your baby's position inside your uterus. He or she may also examine you for early signs of labor. Your prenatal visits may also include checking your blood pressure and, after about 10-12 weeks of pregnancy, listening to your baby's heartbeat. Testing Regular testing often includes:  Urinalysis. This checks your urine for glucose, protein, or signs of infection.  Blood count. This checks the levels of white and red  blood cells in your body.  Tests for sexually transmitted infections (STIs). Testing for STIs at the beginning of pregnancy is routinely done and is required in many states.  Antibody testing. You will be checked to see if you are immune to certain illnesses, such as rubella, that can affect a developing fetus.  Glucose screen. Around 24-28 weeks of pregnancy, your blood glucose level will be checked for signs of gestational diabetes. Follow-up tests may be recommended.  Group B strep. This is a bacteria that is commonly found inside a woman's vagina. This test will inform your health care provider if you need an antibiotic to reduce the amount of this bacteria in your body prior to labor and childbirth.  Ultrasound. Many pregnant women undergo an ultrasound screening around 18-20 weeks of pregnancy to evaluate the health of the fetus and check for any developmental abnormalities.  HIV (human immunodeficiency virus) testing. Early in your pregnancy, you will be screened for HIV. If you are at high risk for HIV, this test may be repeated during your third trimester of pregnancy.  You may be offered other testing based on your age, personal or family medical history, or other factors. HOW OFTEN SHOULD I PLAN TO SEE MY HEALTH CARE PROVIDER FOR PRENATAL CARE? Your prenatal care check-up schedule depends on any medical conditions you have before, or develop during, your pregnancy. If you do not have any underlying medical conditions, you will likely  be seen for checkups:  Monthly, during the first 6 months of pregnancy.  Twice a month during months 7 and 8 of pregnancy.  Weekly starting in the 9th month of pregnancy and until delivery.  If you develop signs of early labor or other concerning signs or symptoms, you may need to see your health care provider more often. Ask your health care provider what prenatal care schedule is best for you. WHAT CAN I DO TO KEEP MYSELF AND MY BABY AS HEALTHY AS  POSSIBLE DURING MY PREGNANCY?  Take a prenatal vitamin containing 400 micrograms (0.4 mg) of folic acid every day. Your health care provider may also ask you to take additional vitamins such as iodine, vitamin D, iron, copper, and zinc.  Take 1500-2000 mg of calcium daily starting at your 20th week of pregnancy until you deliver your baby.  Make sure you are up to date on your vaccinations. Unless directed otherwise by your health care provider: ? You should receive a tetanus, diphtheria, and pertussis (Tdap) vaccination between the 27th and 36th week of your pregnancy, regardless of when your last Tdap immunization occurred. This helps protect your baby from whooping cough (pertussis) after he or she is born. ? You should receive an annual inactivated influenza vaccine (IIV) to help protect you and your baby from influenza. This can be done at any point during your pregnancy.  Eat a well-rounded diet that includes: ? Fresh fruits and vegetables. ? Lean proteins. ? Calcium-rich foods such as milk, yogurt, hard cheeses, and dark, leafy greens. ? Whole grain breads.  Do noteat seafood high in mercury, including: ? Swordfish. ? Tilefish. ? Shark. ? King mackerel. ? More than 6 oz tuna per week.  Do not eat: ? Raw or undercooked meats or eggs. ? Unpasteurized foods, such as soft cheeses (brie, blue, or feta), juices, and milks. ? Lunch meats. ? Hot dogs that have not been heated until they are steaming.  Drink enough water to keep your urine clear or pale yellow. For many women, this may be 10 or more 8 oz glasses of water each day. Keeping yourself hydrated helps deliver nutrients to your baby and may prevent the start of pre-term uterine contractions.  Do not use any tobacco products including cigarettes, chewing tobacco, or electronic cigarettes. If you need help quitting, ask your health care provider.  Do not drink beverages containing alcohol. No safe level of alcohol consumption  during pregnancy has been determined.  Do not use any illegal drugs. These can harm your developing baby or cause a miscarriage.  Ask your health care provider or pharmacist before taking any prescription or over-the-counter medicines, herbs, or supplements.  Limit your caffeine intake to no more than 200 mg per day.  Exercise. Unless told otherwise by your health care provider, try to get 30 minutes of moderate exercise most days of the week. Do not  do high-impact activities, contact sports, or activities with a high risk of falling, such as horseback riding or downhill skiing.  Get plenty of rest.  Avoid anything that raises your body temperature, such as hot tubs and saunas.  If you own a cat, do not empty its litter box. Bacteria contained in cat feces can cause an infection called toxoplasmosis. This can result in serious harm to the fetus.  Stay away from chemicals such as insecticides, lead, mercury, and cleaning or paint products that contain solvents.  Do not have any X-rays taken unless medically necessary.  Take a  childbirth and breastfeeding preparation class. Ask your health care provider if you need a referral or recommendation.  This information is not intended to replace advice given to you by your health care provider. Make sure you discuss any questions you have with your health care provider. Document Released: 02/12/2003 Document Revised: 07/15/2015 Document Reviewed: 04/26/2013 Elsevier Interactive Patient Education  2017 [employer].  Constipation, Adult Constipation is when a person has fewer bowel movements in a week than normal, has difficulty having a bowel movement, or has stools that are dry, hard, or larger than normal. Constipation may be caused by an underlying condition. It may become worse with age if a person takes certain medicines and does not take in enough fluids. Follow these instructions at home: Eating and drinking   Eat foods that have a  lot of fiber, such as fresh fruits and vegetables, whole grains, and beans.  Limit foods that are high in fat, low in fiber, or overly processed, such as french fries, hamburgers, cookies, candies, and soda.  Drink enough fluid to keep your urine clear or pale yellow. General instructions  Exercise regularly or as told by your health care provider.  Go to the restroom when you have the urge to go. Do not hold it in.  Take over-the-counter and prescription medicines only as told by your health care provider. These include any fiber supplements.  Practice pelvic floor retraining exercises, such as deep breathing while relaxing the lower abdomen and pelvic floor relaxation during bowel movements.  Watch your condition for any changes.  Keep all follow-up visits as told by your health care provider. This is important. Contact a health care provider if:  You have pain that gets worse.  You have a fever.  You do not have a bowel movement after 4 days.  You vomit.  You are not hungry.  You lose weight.  You are bleeding from the anus.  You have thin, pencil-like stools. Get help right away if:  You have a fever and your symptoms suddenly get worse.  You leak stool or have blood in your stool.  Your abdomen is bloated.  You have severe pain in your abdomen.  You feel dizzy or you faint. This information is not intended to replace advice given to you by your health care provider. Make sure you discuss any questions you have with your health care provider. Document Released: 11/08/2003 Document Revised: 08/30/2015 Document Reviewed: 07/31/2015 Elsevier Interactive Patient Education  2017 [employer].

## 2016-10-14 NOTE — Progress Notes (Signed)
   PRENATAL VISIT NOTE  Subjective:  Tiffany Sullivan is a 26 y.o. G1P0 at [redacted]w[redacted]d being seen today for ongoing prenatal care.  She is currently monitored for the following issues for this low-risk pregnancy and has Obesity (BMI 30.0-34.9); Hidradenitis; Uterine fibroid; Syncope and collapse; and Supervision of normal first pregnancy, antepartum on her problem list.  Patient reports no bleeding, no contractions, no cramping, no leaking and gas pains and constipation.  Contractions: Not present. Vag. Bleeding: None.  Movement: Present. Denies leaking of fluid.   The following portions of the patient's history were reviewed and updated as appropriate: allergies, current medications, past family history, past medical history, past social history, past surgical history and problem list. Problem list updated.  Objective:   Vitals:   10/14/16 1554  BP: 137/84  Pulse: (!) 102  Weight: 236 lb 1.6 oz (107.1 kg)    Fetal Status: Fetal Heart Rate (bpm): 155; doppler Fundal Height: 19 cm Movement: Present     General:  Alert, oriented and cooperative. Patient is in no acute distress.  Skin: Skin is warm and dry. No rash noted.   Cardiovascular: Normal heart rate noted  Respiratory: Normal respiratory effort, no problems with respiration noted  Abdomen: Soft, gravid, appropriate for gestational age.  Pain/Pressure: Absent     Pelvic: Cervical exam deferred        Extremities: Normal range of motion.  Edema: None  Mental Status:  Normal mood and affect. Normal behavior. Normal judgment and thought content.   Assessment and Plan:  Pregnancy: G1P0 at [redacted]w[redacted]d  1. Obesity (BMI 30.0-34.9)     4 lb weight gain this pregnancy.   2. Supervision of normal first pregnancy, antepartum      OTC colace for constipation.  OTC mylicon for flatulence.   - AFP, Serum, Open Spina Bifida  Preterm labor symptoms and general obstetric precautions including but not limited to vaginal bleeding, contractions, leaking of  fluid and fetal movement were reviewed in detail with the patient. Please refer to After Visit Summary for other counseling recommendations.  Return in about 4 weeks (around 11/11/2016) for ROB.   Morene Crocker, CNM

## 2016-10-17 LAB — AFP, SERUM, OPEN SPINA BIFIDA
AFP MOM: 0.97
AFP Value: 40.5 ng/mL
Gest. Age on Collection Date: 19.1 weeks
Maternal Age At EDD: 27.7 yr
OSBR Risk 1 IN: 10000
Test Results:: NEGATIVE
WEIGHT: 236 [lb_av]

## 2016-10-20 ENCOUNTER — Other Ambulatory Visit: Payer: Self-pay | Admitting: Certified Nurse Midwife

## 2016-10-20 DIAGNOSIS — Z3400 Encounter for supervision of normal first pregnancy, unspecified trimester: Secondary | ICD-10-CM

## 2016-10-21 ENCOUNTER — Ambulatory Visit (HOSPITAL_COMMUNITY)
Admission: RE | Admit: 2016-10-21 | Discharge: 2016-10-21 | Disposition: A | Discharge status: Home / Self Care | Payer: 59 | Source: Ambulatory Visit | Attending: Certified Nurse Midwife | Admitting: Certified Nurse Midwife

## 2016-10-21 ENCOUNTER — Other Ambulatory Visit: Payer: Self-pay | Admitting: Certified Nurse Midwife

## 2016-10-21 DIAGNOSIS — O3410 Maternal care for benign tumor of corpus uteri, unspecified trimester: Secondary | ICD-10-CM | POA: Insufficient documentation

## 2016-10-21 DIAGNOSIS — Z6835 Body mass index (BMI) 35.0-35.9, adult: Secondary | ICD-10-CM | POA: Insufficient documentation

## 2016-10-21 DIAGNOSIS — D259 Leiomyoma of uterus, unspecified: Secondary | ICD-10-CM

## 2016-10-21 DIAGNOSIS — Z363 Encounter for antenatal screening for malformations: Secondary | ICD-10-CM

## 2016-10-21 DIAGNOSIS — Z3A20 20 weeks gestation of pregnancy: Secondary | ICD-10-CM

## 2016-10-21 DIAGNOSIS — Z3400 Encounter for supervision of normal first pregnancy, unspecified trimester: Secondary | ICD-10-CM

## 2016-10-21 DIAGNOSIS — O99212 Obesity complicating pregnancy, second trimester: Secondary | ICD-10-CM | POA: Insufficient documentation

## 2016-10-22 ENCOUNTER — Other Ambulatory Visit: Payer: Self-pay | Admitting: Certified Nurse Midwife

## 2016-10-22 DIAGNOSIS — D259 Leiomyoma of uterus, unspecified: Secondary | ICD-10-CM

## 2016-10-22 DIAGNOSIS — O3410 Maternal care for benign tumor of corpus uteri, unspecified trimester: Principal | ICD-10-CM

## 2016-11-11 ENCOUNTER — Encounter (HOSPITAL_COMMUNITY): Payer: Self-pay | Admitting: *Deleted

## 2016-11-11 ENCOUNTER — Inpatient Hospital Stay (HOSPITAL_COMMUNITY)
Admission: AD | Admit: 2016-11-11 | Discharge: 2016-11-11 | Disposition: A | Discharge status: Home / Self Care | Payer: 59 | Source: Ambulatory Visit | Attending: Obstetrics & Gynecology | Admitting: Obstetrics & Gynecology

## 2016-11-11 ENCOUNTER — Ambulatory Visit (INDEPENDENT_AMBULATORY_CARE_PROVIDER_SITE_OTHER): Payer: 59 | Admitting: Certified Nurse Midwife

## 2016-11-11 ENCOUNTER — Encounter: Payer: Self-pay | Admitting: *Deleted

## 2016-11-11 ENCOUNTER — Inpatient Hospital Stay (HOSPITAL_COMMUNITY): Payer: 59

## 2016-11-11 VITALS — BP 126/76 | HR 101 | Wt 236.6 lb

## 2016-11-11 DIAGNOSIS — O99212 Obesity complicating pregnancy, second trimester: Secondary | ICD-10-CM | POA: Insufficient documentation

## 2016-11-11 DIAGNOSIS — O4692 Antepartum hemorrhage, unspecified, second trimester: Secondary | ICD-10-CM | POA: Insufficient documentation

## 2016-11-11 DIAGNOSIS — O219 Vomiting of pregnancy, unspecified: Secondary | ICD-10-CM

## 2016-11-11 DIAGNOSIS — Z113 Encounter for screening for infections with a predominantly sexual mode of transmission: Secondary | ICD-10-CM | POA: Diagnosis not present

## 2016-11-11 DIAGNOSIS — D259 Leiomyoma of uterus, unspecified: Secondary | ICD-10-CM

## 2016-11-11 DIAGNOSIS — E669 Obesity, unspecified: Secondary | ICD-10-CM

## 2016-11-11 DIAGNOSIS — Z6835 Body mass index (BMI) 35.0-35.9, adult: Secondary | ICD-10-CM | POA: Diagnosis not present

## 2016-11-11 DIAGNOSIS — K5900 Constipation, unspecified: Secondary | ICD-10-CM

## 2016-11-11 DIAGNOSIS — O4690 Antepartum hemorrhage, unspecified, unspecified trimester: Secondary | ICD-10-CM

## 2016-11-11 DIAGNOSIS — O3410 Maternal care for benign tumor of corpus uteri, unspecified trimester: Secondary | ICD-10-CM | POA: Diagnosis not present

## 2016-11-11 DIAGNOSIS — Z3A23 23 weeks gestation of pregnancy: Secondary | ICD-10-CM | POA: Diagnosis not present

## 2016-11-11 DIAGNOSIS — O3412 Maternal care for benign tumor of corpus uteri, second trimester: Secondary | ICD-10-CM

## 2016-11-11 DIAGNOSIS — Z3400 Encounter for supervision of normal first pregnancy, unspecified trimester: Secondary | ICD-10-CM

## 2016-11-11 LAB — URINALYSIS, ROUTINE W REFLEX MICROSCOPIC
BILIRUBIN URINE: NEGATIVE
Glucose, UA: NEGATIVE mg/dL
KETONES UR: NEGATIVE mg/dL
NITRITE: NEGATIVE
Protein, ur: 30 mg/dL — AB
SPECIFIC GRAVITY, URINE: 1.024 (ref 1.005–1.030)
pH: 7 (ref 5.0–8.0)

## 2016-11-11 MED ORDER — SIMETHICONE 80 MG PO CHEW: 80.0000 mg | CHEWABLE_TABLET | Freq: Four times a day (QID) | ORAL | 0 refills | Status: DC | PRN

## 2016-11-11 MED ORDER — PROMETHAZINE HCL 25 MG PO TABS: 25.0000 mg | ORAL_TABLET | Freq: Four times a day (QID) | ORAL | 2 refills | Status: DC | PRN

## 2016-11-11 MED ORDER — POLYETHYLENE GLYCOL 3350 17 G PO PACK: 17.0000 g | PACK | Freq: Every day | ORAL | 2 refills | Status: DC

## 2016-11-11 NOTE — MAU Note (Signed)
Started bleeding yesterday, brownish, small amt.  Sent from office for further eval.  Having pain at the top of uterus, where fibroid is.

## 2016-11-11 NOTE — Progress Notes (Signed)
Declined the FLU vaccine.  Had brown blood whenever she wipes x2 days. She did not noticed it today.

## 2016-11-11 NOTE — Discharge Instructions (Signed)
Constipation, Adult Constipation is when a person:  Poops (has a bowel movement) fewer times in a week than normal.  Has a hard time pooping.  Has poop that is dry, hard, or bigger than normal.  Follow these instructions at home: Eating and drinking   Eat foods that have a lot of fiber, such as: ? Fresh fruits and vegetables. ? Whole grains. ? Beans.  Eat less of foods that are high in fat, low in fiber, or overly processed, such as: ? [place] fries. ? Hamburgers. ? Cookies. ? Candy. ? Soda.  Drink enough fluid to keep your pee (urine) clear or pale yellow. General instructions  Exercise regularly or as told by your doctor.  Go to the restroom when you feel like you need to poop. Do not hold it in.  Take over-the-counter and prescription medicines only as told by your doctor. These include any fiber supplements.  Do pelvic floor retraining exercises, such as: ? Doing deep breathing while relaxing your lower belly (abdomen). ? Relaxing your pelvic floor while pooping.  Watch your condition for any changes.  Keep all follow-up visits as told by your doctor. This is important. Contact a doctor if:  You have pain that gets worse.  You have a fever.  You have not pooped for 4 days.  You throw up (vomit).  You are not hungry.  You lose weight.  You are bleeding from the anus.  You have thin, pencil-like poop (stool). Get help right away if:  You have a fever, and your symptoms suddenly get worse.  You leak poop or have blood in your poop.  Your belly feels hard or bigger than normal (is bloated).  You have very bad belly pain.  You feel dizzy or you faint. This information is not intended to replace advice given to you by your health care provider. Make sure you discuss any questions you have with your health care provider. Document Released: 07/29/2007 Document Revised: 08/30/2015 Document Reviewed: 07/31/2015 Elsevier Interactive Patient Education   2017 [employer]  Vaginal Bleeding During Pregnancy, Second Trimester A small amount of bleeding (spotting) from the vagina is relatively common in pregnancy. It usually stops on its own. Various things can cause bleeding or spotting in pregnancy. Some bleeding may be related to the pregnancy, and some may not. Sometimes the bleeding is normal and is not a problem. However, bleeding can also be a sign of something serious. Be sure to tell your health care provider about any vaginal bleeding right away. Some possible causes of vaginal bleeding during the second trimester include:  Infection, inflammation, or growths on the cervix.  The placenta may be partially or completely covering the opening of the cervix inside the uterus (placenta previa).  The placenta may have separated from the uterus (abruption of the placenta).  You may be having early (preterm) labor.  The cervix may not be strong enough to keep a baby inside the uterus (cervical insufficiency).  Tiny cysts may have developed in the uterus instead of pregnancy tissue (molar pregnancy).  Follow these instructions at home: Watch your condition for any changes. The following actions may help to lessen any discomfort you are feeling:  Follow your health care provider's instructions for limiting your activity. If your health care provider orders bed rest, you may need to stay in bed and only get up to use the bathroom. However, your health care provider may allow you to continue light activity.  If needed, make plans for  someone to help with your regular activities and responsibilities while you are on bed rest.  Keep track of the number of pads you use each day, how often you change pads, and how soaked (saturated) they are. Write this down.  Do not use tampons. Do not douche.  Do not have sexual intercourse or orgasms until approved by your health care provider.  If you pass any tissue from your vagina, save the tissue so you  can show it to your health care provider.  Only take over-the-counter or prescription medicines as directed by your health care provider.  Do not take aspirin because it can make you bleed.  Do not exercise or perform any strenuous activities or heavy lifting without your health care provider's permission.  Keep all follow-up appointments as directed by your health care provider.  Contact a health care provider if:  You have any vaginal bleeding during any part of your pregnancy.  You have cramps or labor pains.  You have a fever, not controlled by medicine. Get help right away if:  You have severe cramps in your back or belly (abdomen).  You have contractions.  You have chills.  You pass large clots or tissue from your vagina.  Your bleeding increases.  You feel light-headed or weak, or you have fainting episodes.  You are leaking fluid or have a gush of fluid from your vagina. This information is not intended to replace advice given to you by your health care provider. Make sure you discuss any questions you have with your health care provider. Document Released: 11/19/2004 Document Revised: 07/18/2015 Document Reviewed: 10/17/2012 Elsevier Interactive Patient Education  [employer].

## 2016-11-11 NOTE — Progress Notes (Signed)
Provider at bedside--orders to continue [facility] but can d/c fetal monitoring--provider to order Ultrasound

## 2016-11-11 NOTE — MAU Provider Note (Signed)
Chief Complaint:  Vaginal Bleeding   First Provider Initiated Contact with Patient 11/11/16 1744     HPI: Tiffany Sullivan is a 26 y.o. G1P0 at 36w1dwho presents to maternity admissions reporting brown spotting and pain at the top of her uterus.  Worried about how we know that the fibroid "is really a fibroid".  Worried it is something more dangerous.  Worried bleeding is a sign of danger to baby.. She reports good fetal movement, denies LOF, vaginal itching/burning, urinary symptoms, h/a, dizziness, n/v, diarrhea, constipation or fever/chills.    Vaginal Bleeding  The patient's primary symptoms include vaginal bleeding. The patient's pertinent negatives include no genital itching, genital lesions, genital odor or pelvic pain. This is a new problem. The current episode started today. The problem has been unchanged. The pain is moderate. The problem affects both sides. She is pregnant. Associated symptoms include abdominal pain (Upper abdomen). Pertinent negatives include no chills, constipation, diarrhea, fever, headaches, nausea or vomiting. The vaginal discharge was brown. The vaginal bleeding is spotting. She has not been passing clots. She has not been passing tissue. Nothing aggravates the symptoms. She has tried nothing for the symptoms.    RN Note: Started bleeding yesterday, brownish, small amt.  Sent from office for further eval.  Having pain at the top of uterus, where fibroid is.  Past Medical History: Past Medical History:  Diagnosis Date  . Allergy   . Anemia   . Fibroid    1 small located on top of ovary  . GERD (gastroesophageal reflux disease)   . Syncope and collapse     Past obstetric history: OB History  Gravida Para Term Preterm AB Living  1 0          SAB TAB Ectopic Multiple Live Births               # Outcome Date GA Lbr Len/2nd Weight Sex Delivery Anes PTL Lv  1 Current               Past Surgical History: No past surgical history on file.  Family  History: Family History  Problem Relation Age of Onset  . Hypertension Mother     Social History: Social History  Substance Use Topics  . Smoking status: Never Smoker  . Smokeless tobacco: Never Used  . Alcohol use 0.0 oz/week     Comment: none since Monday    Allergies: No Known Allergies  Meds:  Prescriptions Prior to Admission  Medication Sig Dispense Refill Last Dose  . DICLEGIS 10-10 MG TBEC Take 1 tablet by mouth 2 (two) times daily. 1 tab in AM, 1 tab mid afternoon 2 tabs at bedtime. Max dose 4 tabs daily. 100 tablet prn Taking  . Prenat-FeCbn-FeAspGl-FA-Omega (OB COMPLETE PETITE) 35-5-1-200 MG CAPS Take 1 tablet by mouth daily. 30 capsule 12 Taking    I have reviewed patient's Past Medical Hx, Surgical Hx, Family Hx, Social Hx, medications and allergies.   ROS:  Review of Systems  Constitutional: Negative for chills and fever.  Gastrointestinal: Positive for abdominal pain (Upper abdomen). Negative for constipation, diarrhea, nausea and vomiting.  Genitourinary: Positive for vaginal bleeding. Negative for pelvic pain.  Neurological: Negative for headaches.   Other systems negative  Physical Exam  Patient Vitals for the past 24 hrs:  BP Temp Temp src Pulse Resp SpO2  11/11/16 1733 126/71 98.1 F (36.7 C) Oral (!) 104 18 99 %   Constitutional: Well-developed, well-nourished female in no acute distress.  Cardiovascular:  normal rate and rhythm Respiratory: normal effort, clear to auscultation bilaterally GI: Abd soft, non-tender, gravid appropriate for gestational age.   No rebound or guarding. MS: Extremities nontender, no edema, normal ROM Neurologic: Alert and oriented x 4.  GU: Neg CVAT.  PELVIC EXAM: Cervix pink, visually closed, without lesion, scant light brown discharge, vaginal walls and external genitalia normal Bimanual exam: Cervix firm, posterior, neg CMT, uterus nontender, Fundal Height consistent with dates, adnexa without tenderness, enlargement,  or mass   FHT:  Baseline 160 , moderate variability, difficult to trace Contractions: Rare   Labs: Results for orders placed or performed during the hospital encounter of 11/11/16 (from the past 72 hour(s))  Urinalysis, Routine w reflex microscopic     Status: Abnormal   Collection Time: 11/11/16  5:35 PM  Result Value Ref Range   Color, Urine AMBER (A) YELLOW    Comment: BIOCHEMICALS MAY BE AFFECTED BY COLOR   APPearance CLOUDY (A) CLEAR   Specific Gravity, Urine 1.024 1.005 - 1.030   pH 7.0 5.0 - 8.0   Glucose, UA NEGATIVE NEGATIVE mg/dL   Hgb urine dipstick SMALL (A) NEGATIVE   Bilirubin Urine NEGATIVE NEGATIVE   Ketones, ur NEGATIVE NEGATIVE mg/dL   Protein, ur 30 (A) NEGATIVE mg/dL   Nitrite NEGATIVE NEGATIVE   Leukocytes, UA SMALL (A) NEGATIVE   RBC / HPF 0-5 0 - 5 RBC/hpf   WBC, UA 0-5 0 - 5 WBC/hpf   Bacteria, UA RARE (A) NONE SEEN   Squamous Epithelial / LPF 6-30 (A) NONE SEEN   Mucus PRESENT     O/Positive/-- (06/19 1125)  Imaging:  [place] Mfm Ob Detail +14 Wk  Result Date: 10/21/2016 ----------------------------------------------------------------------  OBSTETRICS REPORT                      (Signed Final 10/21/2016 02:58 pm) ---------------------------------------------------------------------- Patient Info  ID #:       [id number]                         D.O.B.:   February 05, 1991 (26 yrs)  Name:       Tiffany Sullivan                   Visit Date:  10/21/2016 01:45 pm ---------------------------------------------------------------------- Performed By  Performed By:     Valda Favia          Ref. Address:     [address] [place]  [place]  Attending:        Griffin Dakin MD         Location:         [facility]  Referred By:      Morene Crocker CNM ---------------------------------------------------------------------- Orders   #  Description                                 Code   1  [place] MFM OB DETAIL +14 Kaycee                     76811.01  ----------------------------------------------------------------------   #  Ordered By               Order #        Accession #    Episode #   1  RACHELLE Margurite Auerbach          [id number]      [id number]     [id number]  ---------------------------------------------------------------------- Indications   [redacted] weeks gestation of pregnancy                W7P.71   Obesity complicating pregnancy, second         O99.212   trimester (pregravid BMI 35)   Encounter for antenatal screening for          Z36.3   malformations   Uterine fibroids                               O34.10  ---------------------------------------------------------------------- OB History  Blood Type:            Height:  5'8"   Weight (lb):  236      BMI:   35.88  Gravidity:    1         Term:   0        Prem:   0        SAB:   0  TOP:          0       Ectopic:  0        Living: 0 ---------------------------------------------------------------------- Fetal Evaluation  Num Of Fetuses:     1  Fetal Heart         155  Rate(bpm):  Cardiac Activity:   Observed  Presentation:       Breech  Placenta:           Posterior Fundal, above cervical os  P. Cord Insertion:  Visualized, central  Amniotic Fluid  AFI FV:      Subjectively within normal limits                              Largest Pocket(cm)                              4.5 ---------------------------------------------------------------------- Biometry  BPD:      50.4  mm     G. Age:  21w 2d         88  %    CI:        75.74   %   70 -  86                                                          FL/HC:      18.8   %   16.8 - 19.8  HC:      183.6  mm     G. Age:  20w 5d         69  %    HC/AC:      1.13       1.09 - 1.39  AC:      161.8  mm     G. Age:  21w 2d         79  %    FL/BPD:     68.5   %  FL:       34.5  mm      G. Age:  20w 6d         67  %    FL/AC:      21.3   %   20 - 24  HUM:      34.1  mm     G. Age:  21w 4d         91  %  Est. FW:     396  gm    0 lb 14 oz      61  % ---------------------------------------------------------------------- Gestational Age  LMP:           20w 1d       Date:   06/02/16                 EDD:   03/09/17  U/S Today:     21w 0d                                        EDD:   03/03/17  Best:          20w 1d    Det. By:   LMP  (06/02/16)          EDD:   03/09/17 ---------------------------------------------------------------------- Anatomy  Cranium:               Appears normal         Aortic Arch:            Appears normal  Cavum:                 Appears normal         Ductal Arch:            Appears normal  Ventricles:            Appears normal         Diaphragm:              Appears normal  Choroid Plexus:        Appears normal         Stomach:                Appears normal, left  sided  Cerebellum:            Appears normal         Abdomen:                Appears normal  Posterior Fossa:       Appears normal         Abdominal Wall:         Appears nml (cord                                                                        insert, abd wall)  Nuchal Fold:           Not applicable (>71    Cord Vessels:           Appears normal ([redacted]                         wks GA)                                        vessel cord)  Face:                  Appears normal         Kidneys:                Appear normal                         (orbits and profile)  Lips:                  Appears normal         Bladder:                Appears normal  Thoracic:              Appears normal         Spine:                  Appears normal  Heart:                 Appears normal         Upper Extremities:      Appears normal                         (4CH, axis, and situs  RVOT:                  Not well visualized    Lower Extremities:      Appears normal   LVOT:                  Not well visualized  Other:  Fetus appears to be a female. Heels visualized. Right 5th digit          visualized. Nasal bone visualized. Technically difficult due to fetal          position. ---------------------------------------------------------------------- Cervix Uterus Adnexa  Cervix  Length:            3.3  cm.  Normal appearance by transabdominal scan.  Uterus  No abnormality visualized.  Left Ovary  No adnexal mass visualized.  Right Ovary  No adnexal mass visualized.  Cul De Sac:   No free fluid seen.  Adnexa:       No abnormality visualized. ---------------------------------------------------------------------- Myomas   Site                     L(cm)      W(cm)      D(cm)      Location   Fundus                   9          7.6        7.2        Pedunculated  ----------------------------------------------------------------------   Blood Flow                 RI        PI       Comments  ---------------------------------------------------------------------- Impression  Singleton intrauterine pregnancy at 20+1 weeks, here for  anatomic survey  Review of the anatomy shows no sonographic markers for  aneuploidy or structural anomalies  However, cardiac evaluation should be considered  suboptimal secondary to fetal position  There is a large fundal fibroid as noted above  Amniotic fluid volume is normal  Estimated fetal weight is 396g which is growth in the 61st  percentile ---------------------------------------------------------------------- Recommendations  Recommend repeat scan in 4 weeks to complete anatomic  survey. Periodic evaluations of fetal growth are indicated due  to the patient's uterine fibroids ----------------------------------------------------------------------                 Griffin Dakin, MD Electronically Signed Final Report   10/21/2016 02:58 pm ----------------------------------------------------------------------  [place] Mfm Ob Limited  Result Date:  11/12/2016 ----------------------------------------------------------------------  OBSTETRICS REPORT                      (Signed Final 11/12/2016 10:06 am) ---------------------------------------------------------------------- Patient Info  ID #:       [id number]                         D.O.B.:   22-May-1990 (26 yrs)  Name:       Tiffany Sullivan                   Visit Date:  11/11/2016 06:50 pm ---------------------------------------------------------------------- Performed By  Performed By:     Elisabeth Cara        Ref. Address:     [place] [place]  [place]                                                             [place]  Attending:        Abram Sander MD         Location:         [facility]  Referred By:      Morene Crocker CNM ---------------------------------------------------------------------- Orders   #  Description                                 Code   1  [place] MFM OB LIMITED                           [phone]  ----------------------------------------------------------------------   #  Ordered By               Order #        Accession #    Episode #   1  Hansel Feinstein           [id number]      [id number]     [id number]  ---------------------------------------------------------------------- Indications   [redacted] weeks gestation of pregnancy                H3Z.16   Obesity complicating pregnancy, second         O99.212   trimester (pregravid BMI 35)   Uterine fibroids                               O34.10   Vaginal bleeding in pregnancy, second          O46.92   trimester  ---------------------------------------------------------------------- OB History  Blood Type:            Height:  5'8"   Weight (lb):  236      BMI:   35.88  Gravidity:    1         Term:   0        Prem:   0        SAB:   0  TOP:          0       Ectopic:  0         Living: 0 ---------------------------------------------------------------------- Fetal Evaluation  Num Of Fetuses:     1  Fetal Heart         154  Rate(bpm):  Cardiac Activity:   Observed  Presentation:       Cephalic  Placenta:           Posterior, above cervical os  P. Cord Insertion:  Previously Visualized  Amniotic Fluid  AFI FV:      Subjectively within normal limits                              Largest Pocket(cm)  8.38 ---------------------------------------------------------------------- Gestational Age  LMP:           23w 1d       Date:   06/02/16                 EDD:   03/09/17  Best:          23w 1d    Det. By:   LMP  (06/02/16)          EDD:   03/09/17 ---------------------------------------------------------------------- Cervix Uterus Adnexa  Cervix  Length:              5  cm.  Normal appearance by transabdominal scan.  Uterus  No abnormality visualized.  Left Ovary  Not visualized.  Right Ovary  Not visualized.  Adnexa:       No adnexal mass visualized. No abnormality                visualized. ---------------------------------------------------------------------- Myomas   Site                     L(cm)      W(cm)      D(cm)      Location   Fundal                   8.8        8.7        7.5  ----------------------------------------------------------------------   Blood Flow                 RI        PI       Comments  ---------------------------------------------------------------------- Impression  Single living intrauterine pregnancy at 23w 1d.  Cephalic presentation.  Placenta Posterior, above cervical os.  No sonographic evidence of placenta previa or abruption.  Normal amniotic fluid volume.  Fundal uterine fibroid as detailed above. ---------------------------------------------------------------------- Recommendations  Continue serial ultrasounds for fetal growth. ----------------------------------------------------------------------                   Abram Sander, MD  Electronically Signed Final Report   11/12/2016 10:06 am ----------------------------------------------------------------------   MAU Course/MDM: I have ordered labs and reviewed results. Small leukocytes but suboptimal collection. EFM reviewed, no preterm labor or signs of abruption.  Tender over known large fundal fibroid Consult Dr Elonda Husky with presentation, exam findings and test results.  Treatments in MAU included Mylicon and Miralax.  Pt complains of chronic constipation and gas pains.  Not using anything for this at home.  Still has daily nausea. [place] ordered for patient reassurance to show there is no sign of abruption.  Fibroid is unchanged .Marland Kitchen Discussed she needs to discuss treatment plan for fibroid with a physician who is a Psychologist, sport and exercise.   Message sent to office to change her appt from CNM to MD..    Assessment: 1. Vaginal bleeding during pregnancy   2.     Pain from fibroid   Plan: Discharge home Rx Miralax and Mylicon  May also use fiber Rx Phenergan for nausea Preterm Labor precautions and fetal kick counts Follow up in Office for prenatal visits and recheck of status Appt changed to MD to discuss fibroid  Encouraged to return here or to other Urgent Care/ED if she develops worsening of symptoms, increase in pain, fever, or other concerning symptoms.   Pt stable at time of discharge.  Hansel Feinstein CNM, MSN Certified Nurse-Midwife 11/11/2016 5:45 PM

## 2016-11-11 NOTE — Progress Notes (Signed)
   PRENATAL VISIT NOTE  Subjective:  Tiffany Sullivan is a 26 y.o. G1P0 at [redacted]w[redacted]d being seen today for ongoing prenatal care.  She is currently monitored for the following issues for this low-risk pregnancy and has Obesity (BMI 30.0-34.9); Hidradenitis; Uterine fibroid complicating antenatal care, baby not yet delivered; Syncope and collapse; and Supervision of normal first pregnancy, antepartum on her problem list.  Patient reports no contractions, no leaking and vaginal bleeding for 2 days, denies contractions but states that her fundus is tender, hx of multiple large fibroids.  Contractions: Not present. Vag. Bleeding: Scant.  Movement: Present. Denies leaking of fluid.   The following portions of the patient's history were reviewed and updated as appropriate: allergies, current medications, past family history, past medical history, past social history, past surgical history and problem list. Problem list updated.  Objective:   Vitals:   11/11/16 1607  BP: 126/76  Pulse: (!) 101  Weight: 236 lb 9.6 oz (107.3 kg)    Fetal Status: Fetal Heart Rate (bpm): 156; doppler Fundal Height: 30 cm Movement: Present     General:  Alert, oriented and cooperative. Patient is in no acute distress.  Skin: Skin is warm and dry. No rash noted.   Cardiovascular: Normal heart rate noted  Respiratory: Normal respiratory effort, no problems with respiration noted  Abdomen: Soft, gravid, appropriate for gestational age.  Pain/Pressure: Absent     Pelvic: Cervical exam performed Dilation: Closed Effacement (%): 30    Blood present at os, small rubera  Extremities: Normal range of motion.  Edema: None  Mental Status:  Normal mood and affect. Normal behavior. Normal judgment and thought content.   Assessment and Plan:  Pregnancy: G1P0 at [redacted]w[redacted]d  1. Supervision of normal first pregnancy, antepartum     Vaginal bleeding: discussed POC with D. Poe, CNM  2. Obesity (BMI 30.0-34.9)     4 lb weight gain so far  this pregnancy  3. Uterine fibroid complicating antenatal care, baby not yet delivered     4.9 cm Subserosal Fundus          9     7.6    7.2    Pedunculated 10/22/16   4. Vaginal bleeding in pregnancy, second trimester       - Fetal fibronectin - Cervicovaginal ancillary only  Preterm labor symptoms and general obstetric precautions including but not limited to vaginal bleeding, contractions, leaking of fluid and fetal movement were reviewed in detail with the patient. Please refer to After Visit Summary for other counseling recommendations.  Return in about 4 weeks (around 12/09/2016) for ROB, 2 hr OGTT: TO MAU for evaluation of vaginal bleeding.   Morene Crocker, CNM

## 2016-11-12 ENCOUNTER — Encounter: Payer: Self-pay | Admitting: *Deleted

## 2016-11-13 LAB — FETAL FIBRONECTIN

## 2016-11-13 LAB — CERVICOVAGINAL ANCILLARY ONLY
BACTERIAL VAGINITIS: NEGATIVE
CHLAMYDIA, DNA PROBE: NEGATIVE
Candida vaginitis: NEGATIVE
Neisseria Gonorrhea: NEGATIVE
Trichomonas: NEGATIVE

## 2016-11-24 ENCOUNTER — Ambulatory Visit (HOSPITAL_COMMUNITY)
Admission: RE | Admit: 2016-11-24 | Discharge: 2016-11-24 | Disposition: A | Discharge status: Home / Self Care | Payer: 59 | Source: Ambulatory Visit | Attending: Certified Nurse Midwife | Admitting: Certified Nurse Midwife

## 2016-11-24 ENCOUNTER — Other Ambulatory Visit: Payer: Self-pay | Admitting: Certified Nurse Midwife

## 2016-11-24 DIAGNOSIS — O321XX0 Maternal care for breech presentation, not applicable or unspecified: Secondary | ICD-10-CM | POA: Insufficient documentation

## 2016-11-24 DIAGNOSIS — Z3A25 25 weeks gestation of pregnancy: Secondary | ICD-10-CM

## 2016-11-24 DIAGNOSIS — O3410 Maternal care for benign tumor of corpus uteri, unspecified trimester: Secondary | ICD-10-CM | POA: Diagnosis present

## 2016-11-24 DIAGNOSIS — D259 Leiomyoma of uterus, unspecified: Secondary | ICD-10-CM

## 2016-11-24 DIAGNOSIS — O3412 Maternal care for benign tumor of corpus uteri, second trimester: Secondary | ICD-10-CM | POA: Diagnosis not present

## 2016-11-24 DIAGNOSIS — O99212 Obesity complicating pregnancy, second trimester: Secondary | ICD-10-CM | POA: Diagnosis not present

## 2016-11-24 DIAGNOSIS — Z362 Encounter for other antenatal screening follow-up: Secondary | ICD-10-CM

## 2016-11-26 ENCOUNTER — Other Ambulatory Visit: Payer: Self-pay | Admitting: Certified Nurse Midwife

## 2016-11-26 DIAGNOSIS — D259 Leiomyoma of uterus, unspecified: Secondary | ICD-10-CM

## 2016-11-26 DIAGNOSIS — Z3400 Encounter for supervision of normal first pregnancy, unspecified trimester: Secondary | ICD-10-CM

## 2016-11-26 DIAGNOSIS — O3410 Maternal care for benign tumor of corpus uteri, unspecified trimester: Principal | ICD-10-CM

## 2016-12-03 ENCOUNTER — Ambulatory Visit (HOSPITAL_COMMUNITY): Payer: 59

## 2016-12-09 ENCOUNTER — Encounter: Payer: 59 | Admitting: Certified Nurse Midwife

## 2016-12-09 ENCOUNTER — Other Ambulatory Visit: Payer: 59

## 2016-12-09 ENCOUNTER — Ambulatory Visit (INDEPENDENT_AMBULATORY_CARE_PROVIDER_SITE_OTHER): Payer: 59 | Admitting: Obstetrics and Gynecology

## 2016-12-09 ENCOUNTER — Encounter: Payer: Self-pay | Admitting: Obstetrics

## 2016-12-09 VITALS — BP 129/83 | HR 103 | Wt 237.0 lb

## 2016-12-09 DIAGNOSIS — Z23 Encounter for immunization: Secondary | ICD-10-CM

## 2016-12-09 DIAGNOSIS — O3412 Maternal care for benign tumor of corpus uteri, second trimester: Secondary | ICD-10-CM

## 2016-12-09 DIAGNOSIS — D259 Leiomyoma of uterus, unspecified: Secondary | ICD-10-CM

## 2016-12-09 DIAGNOSIS — Z3402 Encounter for supervision of normal first pregnancy, second trimester: Secondary | ICD-10-CM

## 2016-12-09 DIAGNOSIS — Z3400 Encounter for supervision of normal first pregnancy, unspecified trimester: Secondary | ICD-10-CM

## 2016-12-09 DIAGNOSIS — O3410 Maternal care for benign tumor of corpus uteri, unspecified trimester: Secondary | ICD-10-CM

## 2016-12-09 DIAGNOSIS — L0591 Pilonidal cyst without abscess: Secondary | ICD-10-CM

## 2016-12-09 NOTE — Progress Notes (Signed)
   PRENATAL VISIT NOTE  Subjective:  Tiffany Sullivan is a 26 y.o. G1P0 at [redacted]w[redacted]d being seen today for ongoing prenatal care.  She is currently monitored for the following issues for this low-risk pregnancy and has Obesity (BMI 30.0-34.9); Hidradenitis; Uterine fibroid complicating antenatal care, baby not yet delivered; Syncope and collapse; and Supervision of normal first pregnancy, antepartum on her problem list.  Patient reports pain in bottom, started last week but really only started to hurt yesterday, has had cysts before but none in this area.  Contractions: Not present. Vag. Bleeding: None.  Movement: Present. Denies leaking of fluid.   The following portions of the patient's history were reviewed and updated as appropriate: allergies, current medications, past family history, past medical history, past social history, past surgical history and problem list. Problem list updated.  Objective:   Vitals:   12/09/16 0844  BP: 129/83  Pulse: (!) 103  Weight: 237 lb (107.5 kg)    Fetal Status: Fetal Heart Rate (bpm): 155 Fundal Height: 30 cm Movement: Present     General:  Alert, oriented and cooperative. Patient is in no acute distress.  Skin: Skin is warm and dry. No rash noted.   Cardiovascular: Normal heart rate noted  Respiratory: Normal respiratory effort, no problems with respiration noted  Abdomen: Soft, gravid, appropriate for gestational age.  Pain/Pressure: Present     Pelvic: Cervical exam deferred        1 cm pilonidal cyst at gluteal cleft noted, palpates deep, mildly tender to touch, no obvious head for drainage  Extremities: Normal range of motion.  Edema: None  Mental Status:  Normal mood and affect. Normal behavior. Normal judgment and thought content.   Assessment and Plan:  Pregnancy: G1P0 at [redacted]w[redacted]d  1. Supervision of normal first pregnancy, antepartum - Glucose Tolerance, 2 Hours w/1 Hour - HIV antibody - CBC - RPR  2. Uterine fibroid complicating antenatal  care, baby not yet delivered Consider rescan to assess No pain currently  3. Pilonidal cyst No I&D today Warm compresses and avoid sitting on the area Return 1 week for I&D if not draining before then  Preterm labor symptoms and general obstetric precautions including but not limited to vaginal bleeding, contractions, leaking of fluid and fetal movement were reviewed in detail with the patient. Please refer to After Visit Summary for other counseling recommendations.  Return in about 1 week (around 12/16/2016).   Sloan Leiter, MD

## 2016-12-10 LAB — CBC
HEMATOCRIT: 31.1 % — AB (ref 34.0–46.6)
Hemoglobin: 9.7 g/dL — ABNORMAL LOW (ref 11.1–15.9)
MCH: 23.7 pg — ABNORMAL LOW (ref 26.6–33.0)
MCHC: 31.2 g/dL — AB (ref 31.5–35.7)
MCV: 76 fL — ABNORMAL LOW (ref 79–97)
PLATELETS: 238 10*3/uL (ref 150–379)
RBC: 4.1 x10E6/uL (ref 3.77–5.28)
RDW: 16.2 % — AB (ref 12.3–15.4)
WBC: 11.7 10*3/uL — ABNORMAL HIGH (ref 3.4–10.8)

## 2016-12-10 LAB — GLUCOSE TOLERANCE, 2 HOURS W/ 1HR
GLUCOSE, 1 HOUR: 121 mg/dL (ref 65–179)
GLUCOSE, FASTING: 74 mg/dL (ref 65–91)
Glucose, 2 hour: 104 mg/dL (ref 65–152)

## 2016-12-10 LAB — RPR: RPR Ser Ql: NONREACTIVE

## 2016-12-10 LAB — HIV ANTIBODY (ROUTINE TESTING W REFLEX): HIV SCREEN 4TH GENERATION: NONREACTIVE

## 2016-12-14 ENCOUNTER — Encounter: Payer: Self-pay | Admitting: Obstetrics and Gynecology

## 2016-12-14 DIAGNOSIS — D649 Anemia, unspecified: Secondary | ICD-10-CM | POA: Insufficient documentation

## 2016-12-16 ENCOUNTER — Encounter: Payer: 59 | Admitting: Obstetrics & Gynecology

## 2016-12-28 ENCOUNTER — Encounter: Payer: Self-pay | Admitting: *Deleted

## 2016-12-28 ENCOUNTER — Ambulatory Visit (INDEPENDENT_AMBULATORY_CARE_PROVIDER_SITE_OTHER): Payer: 59 | Admitting: Obstetrics and Gynecology

## 2016-12-28 ENCOUNTER — Encounter: Payer: Self-pay | Admitting: Obstetrics and Gynecology

## 2016-12-28 VITALS — BP 132/79 | HR 111 | Wt 241.0 lb

## 2016-12-28 DIAGNOSIS — Z3400 Encounter for supervision of normal first pregnancy, unspecified trimester: Secondary | ICD-10-CM

## 2016-12-28 DIAGNOSIS — Z3403 Encounter for supervision of normal first pregnancy, third trimester: Secondary | ICD-10-CM

## 2016-12-28 DIAGNOSIS — E669 Obesity, unspecified: Secondary | ICD-10-CM

## 2016-12-28 DIAGNOSIS — O99213 Obesity complicating pregnancy, third trimester: Secondary | ICD-10-CM

## 2016-12-28 NOTE — Progress Notes (Signed)
   PRENATAL VISIT NOTE  Subjective:  Tiffany Sullivan is a 26 y.o. G1P0 at [redacted]w[redacted]d being seen today for ongoing prenatal care.  She is currently monitored for the following issues for this low-risk pregnancy and has Obesity (BMI 30.0-34.9); Hidradenitis; Uterine fibroid complicating antenatal care, baby not yet delivered; Syncope and collapse; Supervision of normal first pregnancy, antepartum; and Anemia on their problem list.  Patient reports no complaints.  Contractions: Not present. Vag. Bleeding: None.  Movement: Present. Denies leaking of fluid.   The following portions of the patient's history were reviewed and updated as appropriate: allergies, current medications, past family history, past medical history, past social history, past surgical history and problem list. Problem list updated.  Objective:   Vitals:   12/28/16 1612  BP: 132/79  Pulse: (!) 111  Weight: 241 lb (109.3 kg)    Fetal Status: Fetal Heart Rate (bpm): 150 Fundal Height: 32 cm Movement: Present     General:  Alert, oriented and cooperative. Patient is in no acute distress.  Skin: Skin is warm and dry. No rash noted.   Cardiovascular: Normal heart rate noted  Respiratory: Normal respiratory effort, no problems with respiration noted  Abdomen: Soft, gravid, appropriate for gestational age.  Pain/Pressure: Absent     Pelvic: Cervical exam deferred        Extremities: Normal range of motion.  Edema: None  Mental Status:  Normal mood and affect. Normal behavior. Normal judgment and thought content.   Assessment and Plan:  Pregnancy: G1P0 at [redacted]w[redacted]d  1. Supervision of normal first pregnancy, antepartum Patient is doing well without complaints Follow up growth ultrasound and fibroid assessment on 11/12 Reviewed glucola results  2. Obesity (BMI 30.0-34.9) Good weight gain thus far  Preterm labor symptoms and general obstetric precautions including but not limited to vaginal bleeding, contractions, leaking of fluid  and fetal movement were reviewed in detail with the patient. Please refer to After Visit Summary for other counseling recommendations.  Return in about 2 weeks (around 01/11/2017) for ROB.   Mora Bellman, MD

## 2017-01-04 ENCOUNTER — Other Ambulatory Visit: Payer: Self-pay | Admitting: Certified Nurse Midwife

## 2017-01-04 ENCOUNTER — Ambulatory Visit (HOSPITAL_COMMUNITY)
Admission: RE | Admit: 2017-01-04 | Discharge: 2017-01-04 | Disposition: A | Discharge status: Home / Self Care | Payer: 59 | Source: Ambulatory Visit | Attending: Certified Nurse Midwife | Admitting: Certified Nurse Midwife

## 2017-01-04 DIAGNOSIS — O99213 Obesity complicating pregnancy, third trimester: Secondary | ICD-10-CM | POA: Diagnosis not present

## 2017-01-04 DIAGNOSIS — Z3A30 30 weeks gestation of pregnancy: Secondary | ICD-10-CM

## 2017-01-04 DIAGNOSIS — D259 Leiomyoma of uterus, unspecified: Secondary | ICD-10-CM | POA: Diagnosis not present

## 2017-01-04 DIAGNOSIS — O3413 Maternal care for benign tumor of corpus uteri, third trimester: Secondary | ICD-10-CM | POA: Insufficient documentation

## 2017-01-04 DIAGNOSIS — Z362 Encounter for other antenatal screening follow-up: Secondary | ICD-10-CM

## 2017-01-04 DIAGNOSIS — O3410 Maternal care for benign tumor of corpus uteri, unspecified trimester: Principal | ICD-10-CM

## 2017-01-05 ENCOUNTER — Other Ambulatory Visit: Payer: Self-pay | Admitting: Certified Nurse Midwife

## 2017-01-05 ENCOUNTER — Inpatient Hospital Stay (HOSPITAL_COMMUNITY)
Admission: AD | Admit: 2017-01-05 | Discharge: 2017-01-05 | Disposition: A | Discharge status: Home / Self Care | Payer: 59 | Source: Ambulatory Visit | Attending: Obstetrics and Gynecology | Admitting: Obstetrics and Gynecology

## 2017-01-05 ENCOUNTER — Encounter (HOSPITAL_COMMUNITY): Payer: Self-pay

## 2017-01-05 DIAGNOSIS — Z3A31 31 weeks gestation of pregnancy: Secondary | ICD-10-CM | POA: Insufficient documentation

## 2017-01-05 DIAGNOSIS — O9A213 Injury, poisoning and certain other consequences of external causes complicating pregnancy, third trimester: Secondary | ICD-10-CM | POA: Diagnosis not present

## 2017-01-05 DIAGNOSIS — O26893 Other specified pregnancy related conditions, third trimester: Secondary | ICD-10-CM | POA: Diagnosis not present

## 2017-01-05 DIAGNOSIS — S0093XA Contusion of unspecified part of head, initial encounter: Secondary | ICD-10-CM | POA: Insufficient documentation

## 2017-01-05 DIAGNOSIS — Y92410 Unspecified street and highway as the place of occurrence of the external cause: Secondary | ICD-10-CM | POA: Insufficient documentation

## 2017-01-05 DIAGNOSIS — Z3400 Encounter for supervision of normal first pregnancy, unspecified trimester: Secondary | ICD-10-CM

## 2017-01-05 DIAGNOSIS — S0990XA Unspecified injury of head, initial encounter: Secondary | ICD-10-CM | POA: Diagnosis not present

## 2017-01-05 DIAGNOSIS — Z3689 Encounter for other specified antenatal screening: Secondary | ICD-10-CM

## 2017-01-05 LAB — RAPID URINE DRUG SCREEN, HOSP PERFORMED
Amphetamines: NOT DETECTED
Barbiturates: NOT DETECTED
Benzodiazepines: NOT DETECTED
Cocaine: NOT DETECTED
OPIATES: NOT DETECTED
Tetrahydrocannabinol: POSITIVE — AB
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
## 2017-01-05 NOTE — Discharge Instructions (Signed)
Braxton Hicks Contractions °Contractions of the uterus can occur throughout pregnancy, but they are not always a sign that you are in labor. You may have practice contractions called Braxton Hicks contractions. These false labor contractions are sometimes confused with true labor. °What are Braxton Hicks contractions? °Braxton Hicks contractions are tightening movements that occur in the muscles of the uterus before labor. Unlike true labor contractions, these contractions do not result in opening (dilation) and thinning of the cervix. Toward the end of pregnancy (32-34 weeks), Braxton Hicks contractions can happen more often and may become stronger. These contractions are sometimes difficult to tell apart from true labor because they can be very uncomfortable. You should not feel embarrassed if you go to the hospital with false labor. °Sometimes, the only way to tell if you are in true labor is for your health care provider to look for changes in the cervix. The health care provider will do a physical exam and may monitor your contractions. If you are not in true labor, the exam should show that your cervix is not dilating and your water has not broken. °If there are no prenatal problems or other health problems associated with your pregnancy, it is completely safe for you to be sent home with false labor. You may continue to have Braxton Hicks contractions until you go into true labor. °How can I tell the difference between true labor and false labor? °· Differences °? False labor °? Contractions last 30-70 seconds.: Contractions are usually shorter and not as strong as true labor contractions. °? Contractions become very regular.: Contractions are usually irregular. °? Discomfort is usually felt in the top of the uterus, and it spreads to the lower abdomen and low back.: Contractions are often felt in the front of the lower abdomen and in the groin. °? Contractions do not go away with walking.: Contractions may  go away when you walk around or change positions while lying down. °? Contractions usually become more intense and increase in frequency.: Contractions get weaker and are shorter-lasting as time goes on. °? The cervix dilates and gets thinner.: The cervix usually does not dilate or become thin. °Follow these instructions at home: °· Take over-the-counter and prescription medicines only as told by your health care provider. °· Keep up with your usual exercises and follow other instructions from your health care provider. °· Eat and drink lightly if you think you are going into labor. °· If Braxton Hicks contractions are making you uncomfortable: °? Change your position from lying down or resting to walking, or change from walking to resting. °? Sit and rest in a tub of warm water. °? Drink enough fluid to keep your urine clear or pale yellow. Dehydration may cause these contractions. °? Do slow and deep breathing several times an hour. °· Keep all follow-up prenatal visits as told by your health care provider. This is important. °Contact a health care provider if: °· You have a fever. °· You have continuous pain in your abdomen. °Get help right away if: °· Your contractions become stronger, more regular, and closer together. °· You have fluid leaking or gushing from your vagina. °· You pass blood-tinged mucus (bloody show). °· You have bleeding from your vagina. °· You have low back pain that you never had before. °· You feel your baby’s head pushing down and causing pelvic pressure. °· Your baby is not moving inside you as much as it used to. °Summary °· Contractions that occur before labor are   called Braxton Hicks contractions, false labor, or practice contractions.  Braxton Hicks contractions are usually shorter, weaker, farther apart, and less regular than true labor contractions. True labor contractions usually become progressively stronger and regular and they become more frequent.  Manage discomfort from  [facility] contractions by changing position, resting in a warm bath, drinking plenty of water, or practicing deep breathing. This information is not intended to replace advice given to you by your health care provider. Make sure you discuss any questions you have with your health care provider. Document Released: 02/09/2005 Document Revised: 12/30/2015 Document Reviewed: 12/30/2015 Elsevier Interactive Patient Education  2017 [employer]. Fetal Movement Counts Patient Name: ________________________________________________ Patient Due Date: ____________________ What is a fetal movement count? A fetal movement count is the number of times that you feel your baby move during a certain amount of time. This may also be called a fetal kick count. A fetal movement count is recommended for every pregnant woman. You may be asked to start counting fetal movements as early as week 28 of your pregnancy. Pay attention to when your baby is most active. You may notice your baby's sleep and wake cycles. You may also notice things that make your baby move more. You should do a fetal movement count:  When your baby is normally most active.  At the same time each day.  A good time to count movements is while you are resting, after having something to eat and drink. How do I count fetal movements? 1. Find a quiet, comfortable area. Sit, or lie down on your side. 2. Write down the date, the start time and stop time, and the number of movements that you felt between those two times. Take this information with you to your health care visits. 3. For 2 hours, count kicks, flutters, swishes, rolls, and jabs. You should feel at least 10 movements during 2 hours. 4. You may stop counting after you have felt 10 movements. 5. If you do not feel 10 movements in 2 hours, have something to eat and drink. Then, keep resting and counting for 1 hour. If you feel at least 4 movements during that hour, you may stop  counting. Contact a health care provider if:  You feel fewer than 4 movements in 2 hours.  Your baby is not moving like he or she usually does. Date: ____________ Start time: ____________ Stop time: ____________ Movements: ____________ Date: ____________ Start time: ____________ Stop time: ____________ Movements: ____________ Date: ____________ Start time: ____________ Stop time: ____________ Movements: ____________ Date: ____________ Start time: ____________ Stop time: ____________ Movements: ____________ Date: ____________ Start time: ____________ Stop time: ____________ Movements: ____________ Date: ____________ Start time: ____________ Stop time: ____________ Movements: ____________ Date: ____________ Start time: ____________ Stop time: ____________ Movements: ____________ Date: ____________ Start time: ____________ Stop time: ____________ Movements: ____________ Date: ____________ Start time: ____________ Stop time: ____________ Movements: ____________ This information is not intended to replace advice given to you by your health care provider. Make sure you discuss any questions you have with your health care provider. Document Released: 03/11/2006 Document Revised: 10/09/2015 Document Reviewed: 03/21/2015 Elsevier Interactive Patient Education  [employer].

## 2017-01-06 NOTE — MAU Note (Signed)
Received a call from pt concerning her note to return to work. Pt was seen on 01/05/2017 and note stated she could return on 01/07/2017. Pt states she needs to return to work today 01/06/2017. Discussed with Dawson,CNM current MAU provider and new note generated.

## 2017-01-11 ENCOUNTER — Ambulatory Visit (INDEPENDENT_AMBULATORY_CARE_PROVIDER_SITE_OTHER): Payer: 59 | Admitting: Obstetrics and Gynecology

## 2017-01-11 ENCOUNTER — Encounter: Payer: Self-pay | Admitting: Obstetrics

## 2017-01-11 ENCOUNTER — Encounter: Payer: Self-pay | Admitting: Obstetrics and Gynecology

## 2017-01-11 VITALS — BP 145/82 | HR 114 | Wt 243.7 lb

## 2017-01-11 DIAGNOSIS — E669 Obesity, unspecified: Secondary | ICD-10-CM

## 2017-01-11 DIAGNOSIS — Z3400 Encounter for supervision of normal first pregnancy, unspecified trimester: Secondary | ICD-10-CM

## 2017-01-11 DIAGNOSIS — Z3403 Encounter for supervision of normal first pregnancy, third trimester: Secondary | ICD-10-CM

## 2017-01-11 NOTE — Progress Notes (Signed)
   PRENATAL VISIT NOTE  Subjective:  Tiffany Sullivan is a 26 y.o. G1P0 at [redacted]w[redacted]d being seen today for ongoing prenatal care.  She is currently monitored for the following issues for this low-risk pregnancy and has Obesity (BMI 30.0-34.9); Hidradenitis; Uterine fibroid complicating antenatal care, baby not yet delivered; Syncope and collapse; Supervision of normal first pregnancy, antepartum; and Anemia on their problem list.  Patient reports no complaints.  Contractions: Not present. Vag. Bleeding: None.  Movement: Present. Denies leaking of fluid.   The following portions of the patient's history were reviewed and updated as appropriate: allergies, current medications, past family history, past medical history, past social history, past surgical history and problem list. Problem list updated.  Objective:   Vitals:   01/11/17 1617 01/11/17 1621  BP: (!) 151/75 (!) 145/82  Pulse: (!) 103 (!) 114  Weight: 243 lb 11.2 oz (110.5 kg)     Fetal Status: Fetal Heart Rate (bpm): 154 Fundal Height: 33 cm Movement: Present     General:  Alert, oriented and cooperative. Patient is in no acute distress.  Skin: Skin is warm and dry. No rash noted.   Cardiovascular: Normal heart rate noted  Respiratory: Normal respiratory effort, no problems with respiration noted  Abdomen: Soft, gravid, appropriate for gestational age.  Pain/Pressure: Absent     Pelvic: Cervical exam deferred        Extremities: Normal range of motion.  Edema: None  Mental Status:  Normal mood and affect. Normal behavior. Normal judgment and thought content.   Assessment and Plan:  Pregnancy: G1P0 at [redacted]w[redacted]d  1. Supervision of normal first pregnancy, antepartum Patient is doing well without complaints Elevated BP today without symptoms- labs obtained Will monitor closely Normal growth ultrasound on 11/12 - CBC - Comprehensive metabolic panel - Protein / creatinine ratio, urine  2. Obesity (BMI 30.0-34.9)   Preterm labor  symptoms and general obstetric precautions including but not limited to vaginal bleeding, contractions, leaking of fluid and fetal movement were reviewed in detail with the patient. Please refer to After Visit Summary for other counseling recommendations.  Return in about 2 weeks (around 01/25/2017) for ROB.   Mora Bellman, MD

## 2017-01-11 NOTE — Progress Notes (Signed)
Patient reports good fetal movement, denies pain. Pt denies headache, dizziness, and blurred vision.

## 2017-01-12 LAB — COMPREHENSIVE METABOLIC PANEL
ALBUMIN: 3.8 g/dL (ref 3.5–5.5)
ALT: 11 IU/L (ref 0–32)
AST: 14 IU/L (ref 0–40)
Albumin/Globulin Ratio: 1.4 (ref 1.2–2.2)
Alkaline Phosphatase: 93 IU/L (ref 39–117)
BUN / CREAT RATIO: 7 — AB (ref 9–23)
BUN: 4 mg/dL — AB (ref 6–20)
Bilirubin Total: 0.4 mg/dL (ref 0.0–1.2)
CALCIUM: 8.9 mg/dL (ref 8.7–10.2)
CO2: 21 mmol/L (ref 20–29)
Chloride: 100 mmol/L (ref 96–106)
Creatinine, Ser: 0.61 mg/dL (ref 0.57–1.00)
GFR, EST AFRICAN AMERICAN: 145 mL/min/{1.73_m2} (ref >59)
GFR, EST NON AFRICAN AMERICAN: 126 mL/min/{1.73_m2} (ref >59)
GLUCOSE: 53 mg/dL — AB (ref 65–99)
Globulin, Total: 2.8 g/dL (ref 1.5–4.5)
Potassium: 4.1 mmol/L (ref 3.5–5.2)
Sodium: 136 mmol/L (ref 134–144)
TOTAL PROTEIN: 6.6 g/dL (ref 6.0–8.5)

## 2017-01-12 LAB — CBC
HEMOGLOBIN: 9.2 g/dL — AB (ref 11.1–15.9)
Hematocrit: 28.7 % — ABNORMAL LOW (ref 34.0–46.6)
MCH: 23.3 pg — AB (ref 26.6–33.0)
MCHC: 32.1 g/dL (ref 31.5–35.7)
MCV: 73 fL — AB (ref 79–97)
PLATELETS: 214 10*3/uL (ref 150–379)
RBC: 3.95 x10E6/uL (ref 3.77–5.28)
RDW: 16.9 % — ABNORMAL HIGH (ref 12.3–15.4)
WBC: 13 10*3/uL — AB (ref 3.4–10.8)

## 2017-01-12 LAB — PROTEIN / CREATININE RATIO, URINE
Creatinine, Urine: 175.9 mg/dL
PROTEIN/CREAT RATIO: 135 mg/g{creat} (ref 0–200)
Protein, Ur: 23.8 mg/dL

## 2017-01-25 ENCOUNTER — Encounter: Payer: Self-pay | Admitting: Obstetrics and Gynecology

## 2017-01-25 ENCOUNTER — Encounter: Payer: Self-pay | Admitting: *Deleted

## 2017-01-25 ENCOUNTER — Ambulatory Visit (INDEPENDENT_AMBULATORY_CARE_PROVIDER_SITE_OTHER): Payer: 59 | Admitting: Obstetrics and Gynecology

## 2017-01-25 VITALS — BP 138/84 | HR 103 | Wt 243.0 lb

## 2017-01-25 DIAGNOSIS — Z3403 Encounter for supervision of normal first pregnancy, third trimester: Secondary | ICD-10-CM

## 2017-01-25 DIAGNOSIS — Z3400 Encounter for supervision of normal first pregnancy, unspecified trimester: Secondary | ICD-10-CM

## 2017-01-25 DIAGNOSIS — E669 Obesity, unspecified: Secondary | ICD-10-CM

## 2017-01-25 NOTE — Progress Notes (Signed)
   PRENATAL VISIT NOTE  Subjective:  Tiffany Sullivan is a 26 y.o. G1P0 at [redacted]w[redacted]d being seen today for ongoing prenatal care.  She is currently monitored for the following issues for this low-risk pregnancy and has Obesity (BMI 30.0-34.9); Hidradenitis; Uterine fibroid complicating antenatal care, baby not yet delivered; Syncope and collapse; Supervision of normal first pregnancy, antepartum; and Anemia on their problem list.  Patient reports no complaints.  Contractions: Not present. Vag. Bleeding: None.  Movement: Present. Denies leaking of fluid.   The following portions of the patient's history were reviewed and updated as appropriate: allergies, current medications, past family history, past medical history, past social history, past surgical history and problem list. Problem list updated.  Objective:   Vitals:   01/25/17 1608  BP: 138/84  Pulse: (!) 103  Weight: 243 lb (110.2 kg)    Fetal Status: Fetal Heart Rate (bpm): 145 Fundal Height: 33 cm Movement: Present     General:  Alert, oriented and cooperative. Patient is in no acute distress.  Skin: Skin is warm and dry. No rash noted.   Cardiovascular: Normal heart rate noted  Respiratory: Normal respiratory effort, no problems with respiration noted  Abdomen: Soft, gravid, appropriate for gestational age.  Pain/Pressure: Absent     Pelvic: Cervical exam deferred        Extremities: Normal range of motion.  Edema: None  Mental Status:  Normal mood and affect. Normal behavior. Normal judgment and thought content.   Assessment and Plan:  Pregnancy: G1P0 at [redacted]w[redacted]d  1. Supervision of normal first pregnancy, antepartum Patient is doing well without complaints Continue monitoring BP Normal labs last visit  2. Obesity (BMI 30.0-34.9) Good weight gain  Preterm labor symptoms and general obstetric precautions including but not limited to vaginal bleeding, contractions, leaking of fluid and fetal movement were reviewed in detail with  the patient. Please refer to After Visit Summary for other counseling recommendations.  Return in about 2 weeks (around 02/08/2017) for ROB.   Mora Bellman, MD

## 2017-02-09 ENCOUNTER — Other Ambulatory Visit (HOSPITAL_COMMUNITY)
Admission: RE | Admit: 2017-02-09 | Discharge: 2017-02-09 | Disposition: A | Discharge status: Home / Self Care | Payer: 59 | Source: Ambulatory Visit | Attending: Obstetrics & Gynecology | Admitting: Obstetrics & Gynecology

## 2017-02-09 ENCOUNTER — Telehealth: Payer: Self-pay | Admitting: Pediatrics

## 2017-02-09 ENCOUNTER — Ambulatory Visit (INDEPENDENT_AMBULATORY_CARE_PROVIDER_SITE_OTHER): Payer: 59 | Admitting: Obstetrics & Gynecology

## 2017-02-09 ENCOUNTER — Encounter: Payer: Self-pay | Admitting: *Deleted

## 2017-02-09 VITALS — BP 129/77 | HR 103 | Wt 241.0 lb

## 2017-02-09 DIAGNOSIS — Z3400 Encounter for supervision of normal first pregnancy, unspecified trimester: Secondary | ICD-10-CM | POA: Insufficient documentation

## 2017-02-09 DIAGNOSIS — Z3A00 Weeks of gestation of pregnancy not specified: Secondary | ICD-10-CM | POA: Diagnosis not present

## 2017-02-09 NOTE — Telephone Encounter (Signed)
Pt called in stating she was having bad vaginal pressure. She is asking if she should go to [facility].  I advised her vaginal pressure is WNL at this point in pregnancy.  She denies ROM or vaginal bleeding. She reports fetal movement WNL.  She states she has appt here this afternoon. I advised her to keep appt here this afternoon, go to [facility] if ROM or bleeding or ctx 5 mins apart for 2 hours consistantly. She voiced understanding and agreed with plan.

## 2017-02-09 NOTE — Patient Instructions (Signed)
Vaginal Delivery Vaginal delivery means that you will give birth by pushing your baby out of your birth canal (vagina). A team of health care providers will help you before, during, and after vaginal delivery. Birth experiences are unique for every woman and every pregnancy, and birth experiences vary depending on where you choose to give birth. What should I do to prepare for my baby's birth? Before your baby is born, it is important to talk with your health care provider about:  Your labor and delivery preferences. These may include: ? Medicines that you may be given. ? How you will manage your pain. This might include non-medical pain relief techniques or injectable pain relief such as epidural analgesia. ? How you and your baby will be monitored during labor and delivery. ? Who may be in the labor and delivery room with you. ? Your feelings about surgical delivery of your baby (cesarean delivery, or C-section) if this becomes necessary. ? Your feelings about receiving donated blood through an IV tube (blood transfusion) if this becomes necessary.  Whether you are able: ? To take pictures or videos of the birth. ? To eat during labor and delivery. ? To move around, walk, or change positions during labor and delivery.  What to expect after your baby is born, such as: ? Whether delayed umbilical cord clamping and cutting is offered. ? Who will care for your baby right after birth. ? Medicines or tests that may be recommended for your baby. ? Whether breastfeeding is supported in your hospital or birth center. ? How long you will be in the hospital or birth center.  How any medical conditions you have may affect your baby or your labor and delivery experience.  To prepare for your baby's birth, you should also:  Attend all of your health care visits before delivery (prenatal visits) as recommended by your health care provider. This is important.  Prepare your home for your baby's  arrival. Make sure that you have: ? Diapers. ? Baby clothing. ? Feeding equipment. ? Safe sleeping arrangements for you and your baby.  Install a car seat in your vehicle. Have your car seat checked by a certified car seat installer to make sure that it is installed safely.  Think about who will help you with your new baby at home for at least the first several weeks after delivery.  What can I expect when I arrive at the birth center or hospital? Once you are in labor and have been admitted into the hospital or birth center, your health care provider may:  Review your pregnancy history and any concerns you have.  Insert an IV tube into one of your veins. This is used to give you fluids and medicines.  Check your blood pressure, pulse, temperature, and heart rate (vital signs).  Check whether your bag of water (amniotic sac) has broken (ruptured).  Talk with you about your birth plan and discuss pain control options.  Monitoring Your health care provider may monitor your contractions (uterine monitoring) and your baby's heart rate (fetal monitoring). You may need to be monitored:  Often, but not continuously (intermittently).  All the time or for long periods at a time (continuously). Continuous monitoring may be needed if: ? You are taking certain medicines, such as medicine to relieve pain or make your contractions stronger. ? You have pregnancy or labor complications.  Monitoring may be done by:  Placing a special stethoscope or a handheld monitoring device on your abdomen to   check your baby's heartbeat, and feeling your abdomen for contractions. This method of monitoring does not continuously record your baby's heartbeat or your contractions.  Placing monitors on your abdomen (external monitors) to record your baby's heartbeat and the frequency and length of contractions. You may not have to wear external monitors all the time.  Placing monitors inside of your uterus  (internal monitors) to record your baby's heartbeat and the frequency, length, and strength of your contractions. ? Your health care provider may use internal monitors if he or she needs more information about the strength of your contractions or your baby's heart rate. ? Internal monitors are put in place by passing a thin, flexible wire through your vagina and into your uterus. Depending on the type of monitor, it may remain in your uterus or on your baby's head until birth. ? Your health care provider will discuss the benefits and risks of internal monitoring with you and will ask for your permission before inserting the monitors.  Telemetry. This is a type of continuous monitoring that can be done with external or internal monitors. Instead of having to stay in bed, you are able to move around during telemetry. Ask your health care provider if telemetry is an option for you.  Physical exam Your health care provider may perform a physical exam. This may include:  Checking whether your baby is positioned: ? With the head toward your vagina (head-down). This is most common. ? With the head toward the top of your uterus (head-up or breech). If your baby is in a breech position, your health care provider may try to turn your baby to a head-down position so you can deliver vaginally. If it does not seem that your baby can be born vaginally, your provider may recommend surgery to deliver your baby. In rare cases, you may be able to deliver vaginally if your baby is head-up (breech delivery). ? Lying sideways (transverse). Babies that are lying sideways cannot be delivered vaginally.  Checking your cervix to determine: ? Whether it is thinning out (effacing). ? Whether it is opening up (dilating). ? How low your baby has moved into your birth canal.  What are the three stages of labor and delivery?  Normal labor and delivery is divided into the following three stages: Stage 1  Stage 1 is the  longest stage of labor, and it can last for hours or days. Stage 1 includes: ? Early labor. This is when contractions may be irregular, or regular and mild. Generally, early labor contractions are more than 10 minutes apart. ? Active labor. This is when contractions get longer, more regular, more frequent, and more intense. ? The transition phase. This is when contractions happen very close together, are very intense, and may last longer than during any other part of labor.  Contractions generally feel mild, infrequent, and irregular at first. They get stronger, more frequent (about every 2-3 minutes), and more regular as you progress from early labor through active labor and transition.  Many women progress through stage 1 naturally, but you may need help to continue making progress. If this happens, your health care provider may talk with you about: ? Rupturing your amniotic sac if it has not ruptured yet. ? Giving you medicine to help make your contractions stronger and more frequent.  Stage 1 ends when your cervix is completely dilated to 4 inches (10 cm) and completely effaced. This happens at the end of the transition phase. Stage 2  Once   your cervix is completely effaced and dilated to 4 inches (10 cm), you may start to feel an urge to push. It is common for the body to naturally take a rest before feeling the urge to push, especially if you received an epidural or certain other pain medicines. This rest period may last for up to 1-2 hours, depending on your unique labor experience.  During stage 2, contractions are generally less painful, because pushing helps relieve contraction pain. Instead of contraction pain, you may feel stretching and burning pain, especially when the widest part of your baby's head passes through the vaginal opening (crowning).  Your health care provider will closely monitor your pushing progress and your baby's progress through the vagina during stage 2.  Your  health care provider may massage the area of skin between your vaginal opening and anus (perineum) or apply warm compresses to your perineum. This helps it stretch as the baby's head starts to crown, which can help prevent perineal tearing. ? In some cases, an incision may be made in your perineum (episiotomy) to allow the baby to pass through the vaginal opening. An episiotomy helps to make the opening of the vagina larger to allow more room for the baby to fit through.  It is very important to breathe and focus so your health care provider can control the delivery of your baby's head. Your health care provider may have you decrease the intensity of your pushing, to help prevent perineal tearing.  After delivery of your baby's head, the shoulders and the rest of the body generally deliver very quickly and without difficulty.  Once your baby is delivered, the umbilical cord may be cut right away, or this may be delayed for 1-2 minutes, depending on your baby's health. This may vary among health care providers, hospitals, and birth centers.  If you and your baby are healthy enough, your baby may be placed on your chest or abdomen to help maintain the baby's temperature and to help you bond with each other. Some mothers and babies start breastfeeding at this time. Your health care team will dry your baby and help keep your baby warm during this time.  Your baby may need immediate care if he or she: ? Showed signs of distress during labor. ? Has a medical condition. ? Was born too early (prematurely). ? Had a bowel movement before birth (meconium). ? Shows signs of difficulty transitioning from being inside the uterus to being outside of the uterus. If you are planning to breastfeed, your health care team will help you begin a feeding. Stage 3  The third stage of labor starts immediately after the birth of your baby and ends after you deliver the placenta. The placenta is an organ that develops  during pregnancy to provide oxygen and nutrients to your baby in the womb.  Delivering the placenta may require some pushing, and you may have mild contractions. Breastfeeding can stimulate contractions to help you deliver the placenta.  After the placenta is delivered, your uterus should tighten (contract) and become firm. This helps to stop bleeding in your uterus. To help your uterus contract and to control bleeding, your health care provider may: ? Give you medicine by injection, through an IV tube, by mouth, or through your rectum (rectally). ? Massage your abdomen or perform a vaginal exam to remove any blood clots that are left in your uterus. ? Empty your bladder by placing a thin, flexible tube (catheter) into your bladder. ? Encourage   you to breastfeed your baby. After labor is over, you and your baby will be monitored closely to ensure that you are both healthy until you are ready to go home. Your health care team will teach you how to care for yourself and your baby. This information is not intended to replace advice given to you by your health care provider. Make sure you discuss any questions you have with your health care provider. Document Released: 11/19/2007 Document Revised: 08/30/2015 Document Reviewed: 02/24/2015 Elsevier Interactive Patient Education  2018 [employer]  

## 2017-02-09 NOTE — Progress Notes (Signed)
   PRENATAL VISIT NOTE  Subjective:  Tiffany Sullivan is a 26 y.o. G1P0 at 107w0d being seen today for ongoing prenatal care.  She is currently monitored for the following issues for this low-risk pregnancy and has Obesity (BMI 30.0-34.9); Hidradenitis; Uterine fibroid complicating antenatal care, baby not yet delivered; Syncope and collapse; Supervision of normal first pregnancy, antepartum; and Anemia on their problem list.  Patient reports occasional contractions and pelvic pressure.  Contractions: Irregular. Vag. Bleeding: None.  Movement: Present. Denies leaking of fluid.   The following portions of the patient's history were reviewed and updated as appropriate: allergies, current medications, past family history, past medical history, past social history, past surgical history and problem list. Problem list updated.  Objective:   Vitals:   02/09/17 1623  BP: 129/77  Pulse: (!) 103  Weight: 241 lb (109.3 kg)    Fetal Status: Fetal Heart Rate (bpm): 150   Movement: Present     General:  Alert, oriented and cooperative. Patient is in no acute distress.  Skin: Skin is warm and dry. No rash noted.   Cardiovascular: Normal heart rate noted  Respiratory: Normal respiratory effort, no problems with respiration noted  Abdomen: Soft, gravid, appropriate for gestational age.  Pain/Pressure: Present     Pelvic: Cervical exam performed        Extremities: Normal range of motion.  Edema: None  Mental Status:  Normal mood and affect. Normal behavior. Normal judgment and thought content.   Assessment and Plan:  Pregnancy: G1P0 at [redacted]w[redacted]d  1. Supervision of normal first pregnancy, antepartum Cephalic by U s today, limited or presentation  Preterm labor symptoms and general obstetric precautions including but not limited to vaginal bleeding, contractions, leaking of fluid and fetal movement were reviewed in detail with the patient. Please refer to After Visit Summary for other counseling  recommendations.  Return in about 1 week (around 02/16/2017).   Emeterio Reeve, MD

## 2017-02-10 LAB — CERVICOVAGINAL ANCILLARY ONLY
Bacterial vaginitis: NEGATIVE
Candida vaginitis: NEGATIVE
Chlamydia: NEGATIVE
NEISSERIA GONORRHEA: NEGATIVE
Trichomonas: NEGATIVE

## 2017-02-11 LAB — STREP GP B NAA: STREP GROUP B AG: NEGATIVE

## 2017-02-19 ENCOUNTER — Ambulatory Visit (INDEPENDENT_AMBULATORY_CARE_PROVIDER_SITE_OTHER): Payer: 59 | Admitting: Obstetrics & Gynecology

## 2017-02-19 ENCOUNTER — Encounter: Payer: Self-pay | Admitting: *Deleted

## 2017-02-19 VITALS — BP 127/82 | HR 96 | Wt 243.3 lb

## 2017-02-19 DIAGNOSIS — Z3403 Encounter for supervision of normal first pregnancy, third trimester: Secondary | ICD-10-CM

## 2017-02-19 DIAGNOSIS — Z3400 Encounter for supervision of normal first pregnancy, unspecified trimester: Secondary | ICD-10-CM

## 2017-02-19 NOTE — Progress Notes (Signed)
   PRENATAL VISIT NOTE  Subjective:  Tiffany Sullivan is a 26 y.o. G1P0 at [redacted]w[redacted]d being seen today for ongoing prenatal care.  She is currently monitored for the following issues for this low-risk pregnancy and has Obesity (BMI 30.0-34.9); Hidradenitis; Uterine fibroid complicating antenatal care, baby not yet delivered; Syncope and collapse; Supervision of normal first pregnancy, antepartum; and Anemia on their problem list.  Patient reports occasional contractions and pressure. Feels she should stop work in 1 week.  Contractions: Irritability. Vag. Bleeding: None.  Movement: Present. Denies leaking of fluid.   The following portions of the patient's history were reviewed and updated as appropriate: allergies, current medications, past family history, past medical history, past social history, past surgical history and problem list. Problem list updated.  Objective:   Vitals:   02/19/17 1615  BP: 127/82  Pulse: 96  Weight: 243 lb 4.8 oz (110.4 kg)    Fetal Status: Fetal Heart Rate (bpm): 154   Movement: Present     General:  Alert, oriented and cooperative. Patient is in no acute distress.  Skin: Skin is warm and dry. No rash noted.   Cardiovascular: Normal heart rate noted  Respiratory: Normal respiratory effort, no problems with respiration noted  Abdomen: Soft, gravid, appropriate for gestational age.  Pain/Pressure: Present     Pelvic: Cervical exam deferred        Extremities: Normal range of motion.  Edema: None  Mental Status:  Normal mood and affect. Normal behavior. Normal judgment and thought content.   Assessment and Plan:  Pregnancy: G1P0 at [redacted]w[redacted]d  1. Supervision of normal first pregnancy, antepartum Discomforts of pregnancy  Term labor symptoms and general obstetric precautions including but not limited to vaginal bleeding, contractions, leaking of fluid and fetal movement were reviewed in detail with the patient. Please refer to After Visit Summary for other counseling  recommendations.  Return in about 1 week (around 02/26/2017). Note to stop work after 02/27/17  Emeterio Reeve, MD

## 2017-02-19 NOTE — Progress Notes (Signed)
Complains of pressure, wants to stop working.

## 2017-02-19 NOTE — Patient Instructions (Signed)
Vaginal Delivery Vaginal delivery means that you will give birth by pushing your baby out of your birth canal (vagina). A team of health care providers will help you before, during, and after vaginal delivery. Birth experiences are unique for every woman and every pregnancy, and birth experiences vary depending on where you choose to give birth. What should I do to prepare for my baby's birth? Before your baby is born, it is important to talk with your health care provider about:  Your labor and delivery preferences. These may include: ? Medicines that you may be given. ? How you will manage your pain. This might include non-medical pain relief techniques or injectable pain relief such as epidural analgesia. ? How you and your baby will be monitored during labor and delivery. ? Who may be in the labor and delivery room with you. ? Your feelings about surgical delivery of your baby (cesarean delivery, or C-section) if this becomes necessary. ? Your feelings about receiving donated blood through an IV tube (blood transfusion) if this becomes necessary.  Whether you are able: ? To take pictures or videos of the birth. ? To eat during labor and delivery. ? To move around, walk, or change positions during labor and delivery.  What to expect after your baby is born, such as: ? Whether delayed umbilical cord clamping and cutting is offered. ? Who will care for your baby right after birth. ? Medicines or tests that may be recommended for your baby. ? Whether breastfeeding is supported in your hospital or birth center. ? How long you will be in the hospital or birth center.  How any medical conditions you have may affect your baby or your labor and delivery experience.  To prepare for your baby's birth, you should also:  Attend all of your health care visits before delivery (prenatal visits) as recommended by your health care provider. This is important.  Prepare your home for your baby's  arrival. Make sure that you have: ? Diapers. ? Baby clothing. ? Feeding equipment. ? Safe sleeping arrangements for you and your baby.  Install a car seat in your vehicle. Have your car seat checked by a certified car seat installer to make sure that it is installed safely.  Think about who will help you with your new baby at home for at least the first several weeks after delivery.  What can I expect when I arrive at the birth center or hospital? Once you are in labor and have been admitted into the hospital or birth center, your health care provider may:  Review your pregnancy history and any concerns you have.  Insert an IV tube into one of your veins. This is used to give you fluids and medicines.  Check your blood pressure, pulse, temperature, and heart rate (vital signs).  Check whether your bag of water (amniotic sac) has broken (ruptured).  Talk with you about your birth plan and discuss pain control options.  Monitoring Your health care provider may monitor your contractions (uterine monitoring) and your baby's heart rate (fetal monitoring). You may need to be monitored:  Often, but not continuously (intermittently).  All the time or for long periods at a time (continuously). Continuous monitoring may be needed if: ? You are taking certain medicines, such as medicine to relieve pain or make your contractions stronger. ? You have pregnancy or labor complications.  Monitoring may be done by:  Placing a special stethoscope or a handheld monitoring device on your abdomen to   check your baby's heartbeat, and feeling your abdomen for contractions. This method of monitoring does not continuously record your baby's heartbeat or your contractions.  Placing monitors on your abdomen (external monitors) to record your baby's heartbeat and the frequency and length of contractions. You may not have to wear external monitors all the time.  Placing monitors inside of your uterus  (internal monitors) to record your baby's heartbeat and the frequency, length, and strength of your contractions. ? Your health care provider may use internal monitors if he or she needs more information about the strength of your contractions or your baby's heart rate. ? Internal monitors are put in place by passing a thin, flexible wire through your vagina and into your uterus. Depending on the type of monitor, it may remain in your uterus or on your baby's head until birth. ? Your health care provider will discuss the benefits and risks of internal monitoring with you and will ask for your permission before inserting the monitors.  Telemetry. This is a type of continuous monitoring that can be done with external or internal monitors. Instead of having to stay in bed, you are able to move around during telemetry. Ask your health care provider if telemetry is an option for you.  Physical exam Your health care provider may perform a physical exam. This may include:  Checking whether your baby is positioned: ? With the head toward your vagina (head-down). This is most common. ? With the head toward the top of your uterus (head-up or breech). If your baby is in a breech position, your health care provider may try to turn your baby to a head-down position so you can deliver vaginally. If it does not seem that your baby can be born vaginally, your provider may recommend surgery to deliver your baby. In rare cases, you may be able to deliver vaginally if your baby is head-up (breech delivery). ? Lying sideways (transverse). Babies that are lying sideways cannot be delivered vaginally.  Checking your cervix to determine: ? Whether it is thinning out (effacing). ? Whether it is opening up (dilating). ? How low your baby has moved into your birth canal.  What are the three stages of labor and delivery?  Normal labor and delivery is divided into the following three stages: Stage 1  Stage 1 is the  longest stage of labor, and it can last for hours or days. Stage 1 includes: ? Early labor. This is when contractions may be irregular, or regular and mild. Generally, early labor contractions are more than 10 minutes apart. ? Active labor. This is when contractions get longer, more regular, more frequent, and more intense. ? The transition phase. This is when contractions happen very close together, are very intense, and may last longer than during any other part of labor.  Contractions generally feel mild, infrequent, and irregular at first. They get stronger, more frequent (about every 2-3 minutes), and more regular as you progress from early labor through active labor and transition.  Many women progress through stage 1 naturally, but you may need help to continue making progress. If this happens, your health care provider may talk with you about: ? Rupturing your amniotic sac if it has not ruptured yet. ? Giving you medicine to help make your contractions stronger and more frequent.  Stage 1 ends when your cervix is completely dilated to 4 inches (10 cm) and completely effaced. This happens at the end of the transition phase. Stage 2  Once   your cervix is completely effaced and dilated to 4 inches (10 cm), you may start to feel an urge to push. It is common for the body to naturally take a rest before feeling the urge to push, especially if you received an epidural or certain other pain medicines. This rest period may last for up to 1-2 hours, depending on your unique labor experience.  During stage 2, contractions are generally less painful, because pushing helps relieve contraction pain. Instead of contraction pain, you may feel stretching and burning pain, especially when the widest part of your baby's head passes through the vaginal opening (crowning).  Your health care provider will closely monitor your pushing progress and your baby's progress through the vagina during stage 2.  Your  health care provider may massage the area of skin between your vaginal opening and anus (perineum) or apply warm compresses to your perineum. This helps it stretch as the baby's head starts to crown, which can help prevent perineal tearing. ? In some cases, an incision may be made in your perineum (episiotomy) to allow the baby to pass through the vaginal opening. An episiotomy helps to make the opening of the vagina larger to allow more room for the baby to fit through.  It is very important to breathe and focus so your health care provider can control the delivery of your baby's head. Your health care provider may have you decrease the intensity of your pushing, to help prevent perineal tearing.  After delivery of your baby's head, the shoulders and the rest of the body generally deliver very quickly and without difficulty.  Once your baby is delivered, the umbilical cord may be cut right away, or this may be delayed for 1-2 minutes, depending on your baby's health. This may vary among health care providers, hospitals, and birth centers.  If you and your baby are healthy enough, your baby may be placed on your chest or abdomen to help maintain the baby's temperature and to help you bond with each other. Some mothers and babies start breastfeeding at this time. Your health care team will dry your baby and help keep your baby warm during this time.  Your baby may need immediate care if he or she: ? Showed signs of distress during labor. ? Has a medical condition. ? Was born too early (prematurely). ? Had a bowel movement before birth (meconium). ? Shows signs of difficulty transitioning from being inside the uterus to being outside of the uterus. If you are planning to breastfeed, your health care team will help you begin a feeding. Stage 3  The third stage of labor starts immediately after the birth of your baby and ends after you deliver the placenta. The placenta is an organ that develops  during pregnancy to provide oxygen and nutrients to your baby in the womb.  Delivering the placenta may require some pushing, and you may have mild contractions. Breastfeeding can stimulate contractions to help you deliver the placenta.  After the placenta is delivered, your uterus should tighten (contract) and become firm. This helps to stop bleeding in your uterus. To help your uterus contract and to control bleeding, your health care provider may: ? Give you medicine by injection, through an IV tube, by mouth, or through your rectum (rectally). ? Massage your abdomen or perform a vaginal exam to remove any blood clots that are left in your uterus. ? Empty your bladder by placing a thin, flexible tube (catheter) into your bladder. ? Encourage   you to breastfeed your baby. After labor is over, you and your baby will be monitored closely to ensure that you are both healthy until you are ready to go home. Your health care team will teach you how to care for yourself and your baby. This information is not intended to replace advice given to you by your health care provider. Make sure you discuss any questions you have with your health care provider. Document Released: 11/19/2007 Document Revised: 08/30/2015 Document Reviewed: 02/24/2015 Elsevier Interactive Patient Education  2018 [employer]  

## 2017-02-23 NOTE — L&D Delivery Note (Signed)
Patient is 27 y.o. G1P0 [redacted]w[redacted]d admitted for SROM at 01:30.   Prenatal course also complicated by THC positive on 01/05/17, anemia, uterine fibroids and obesity.  Delivery Note At 2:32 AM a viable female was delivered via  (Presentation: ROA;).  APGAR: 8, 9; weight pending.   Placenta status: spontaneous, intact.  Cord: 3-vessel with no complications.   Anesthesia:  Epidural Episiotomy:  no Lacerations:  none Suture Repair: n/a Est. Blood Loss (mL):  200 cc  Mom to postpartum.  Baby to Couplet care / Skin to Skin.  Lovenia Kim, MD  03/06/2017, 2:49 AM

## 2017-02-25 ENCOUNTER — Ambulatory Visit (INDEPENDENT_AMBULATORY_CARE_PROVIDER_SITE_OTHER): Payer: 59 | Admitting: Obstetrics and Gynecology

## 2017-02-25 ENCOUNTER — Encounter: Payer: Self-pay | Admitting: *Deleted

## 2017-02-25 ENCOUNTER — Encounter: Payer: 59 | Admitting: Obstetrics

## 2017-02-25 ENCOUNTER — Encounter: Payer: Self-pay | Admitting: Obstetrics and Gynecology

## 2017-02-25 VITALS — BP 127/75 | HR 99 | Wt 241.1 lb

## 2017-02-25 DIAGNOSIS — D219 Benign neoplasm of connective and other soft tissue, unspecified: Secondary | ICD-10-CM

## 2017-02-25 DIAGNOSIS — Z3400 Encounter for supervision of normal first pregnancy, unspecified trimester: Secondary | ICD-10-CM

## 2017-02-25 NOTE — Progress Notes (Signed)
Pt c/o vaginal pressure

## 2017-02-25 NOTE — Progress Notes (Signed)
   PRENATAL VISIT NOTE  Subjective:  Tiffany Sullivan is a 27 y.o. G1P0 at [redacted]w[redacted]d being seen today for ongoing prenatal care.  She is currently monitored for the following issues for this low-risk pregnancy and has Obesity (BMI 30.0-34.9); Hidradenitis; Uterine fibroid complicating antenatal care, baby not yet delivered; Syncope and collapse; Supervision of normal first pregnancy, antepartum; and Anemia on their problem list.  Patient reports pressure and maybe contractions.  Contractions: Irritability. Vag. Bleeding: None.  Movement: Present. Denies leaking of fluid.   The following portions of the patient's history were reviewed and updated as appropriate: allergies, current medications, past family history, past medical history, past social history, past surgical history and problem list. Problem list updated.  Objective:   Vitals:   02/25/17 1617  BP: 127/75  Pulse: 99  Weight: 241 lb 1.6 oz (109.4 kg)    Fetal Status: Fetal Heart Rate (bpm): 153 Fundal Height: 39 cm Movement: Present     General:  Alert, oriented and cooperative. Patient is in no acute distress.  Skin: Skin is warm and dry. No rash noted.   Cardiovascular: Normal heart rate noted  Respiratory: Normal respiratory effort, no problems with respiration noted  Abdomen: Soft, gravid, appropriate for gestational age.  Pain/Pressure: Present   Cephalic by palpation  Pelvic: Cervical exam deferred        Extremities: Normal range of motion.  Edema: None  Mental Status:  Normal mood and affect. Normal behavior. Normal judgment and thought content.   Assessment and Plan:  Pregnancy: G1P0 at [redacted]w[redacted]d  1. Supervision of normal first pregnancy, antepartum  2. Uterine fibroids Repeat growth scan  Term labor symptoms and general obstetric precautions including but not limited to vaginal bleeding, contractions, leaking of fluid and fetal movement were reviewed in detail with the patient. Please refer to After Visit Summary for  other counseling recommendations.  Return in about 1 week (around 03/04/2017) for OB visit.   Sloan Leiter, MD

## 2017-03-04 ENCOUNTER — Encounter: Payer: Self-pay | Admitting: Obstetrics & Gynecology

## 2017-03-04 ENCOUNTER — Encounter: Payer: Self-pay | Admitting: *Deleted

## 2017-03-04 ENCOUNTER — Ambulatory Visit (HOSPITAL_COMMUNITY)
Admission: RE | Admit: 2017-03-04 | Discharge: 2017-03-04 | Disposition: A | Discharge status: Home / Self Care | Payer: 59 | Source: Ambulatory Visit | Attending: Obstetrics and Gynecology | Admitting: Obstetrics and Gynecology

## 2017-03-04 ENCOUNTER — Ambulatory Visit (INDEPENDENT_AMBULATORY_CARE_PROVIDER_SITE_OTHER): Payer: 59 | Admitting: Obstetrics & Gynecology

## 2017-03-04 DIAGNOSIS — Z3400 Encounter for supervision of normal first pregnancy, unspecified trimester: Secondary | ICD-10-CM

## 2017-03-04 DIAGNOSIS — O99213 Obesity complicating pregnancy, third trimester: Secondary | ICD-10-CM | POA: Insufficient documentation

## 2017-03-04 DIAGNOSIS — Z3A39 39 weeks gestation of pregnancy: Secondary | ICD-10-CM

## 2017-03-04 DIAGNOSIS — D219 Benign neoplasm of connective and other soft tissue, unspecified: Secondary | ICD-10-CM

## 2017-03-04 DIAGNOSIS — O3413 Maternal care for benign tumor of corpus uteri, third trimester: Secondary | ICD-10-CM

## 2017-03-04 NOTE — Patient Instructions (Signed)
Vaginal Delivery Vaginal delivery means that you will give birth by pushing your baby out of your birth canal (vagina). A team of health care providers will help you before, during, and after vaginal delivery. Birth experiences are unique for every woman and every pregnancy, and birth experiences vary depending on where you choose to give birth. What should I do to prepare for my baby's birth? Before your baby is born, it is important to talk with your health care provider about:  Your labor and delivery preferences. These may include: ? Medicines that you may be given. ? How you will manage your pain. This might include non-medical pain relief techniques or injectable pain relief such as epidural analgesia. ? How you and your baby will be monitored during labor and delivery. ? Who may be in the labor and delivery room with you. ? Your feelings about surgical delivery of your baby (cesarean delivery, or C-section) if this becomes necessary. ? Your feelings about receiving donated blood through an IV tube (blood transfusion) if this becomes necessary.  Whether you are able: ? To take pictures or videos of the birth. ? To eat during labor and delivery. ? To move around, walk, or change positions during labor and delivery.  What to expect after your baby is born, such as: ? Whether delayed umbilical cord clamping and cutting is offered. ? Who will care for your baby right after birth. ? Medicines or tests that may be recommended for your baby. ? Whether breastfeeding is supported in your hospital or birth center. ? How long you will be in the hospital or birth center.  How any medical conditions you have may affect your baby or your labor and delivery experience.  To prepare for your baby's birth, you should also:  Attend all of your health care visits before delivery (prenatal visits) as recommended by your health care provider. This is important.  Prepare your home for your baby's  arrival. Make sure that you have: ? Diapers. ? Baby clothing. ? Feeding equipment. ? Safe sleeping arrangements for you and your baby.  Install a car seat in your vehicle. Have your car seat checked by a certified car seat installer to make sure that it is installed safely.  Think about who will help you with your new baby at home for at least the first several weeks after delivery.  What can I expect when I arrive at the birth center or hospital? Once you are in labor and have been admitted into the hospital or birth center, your health care provider may:  Review your pregnancy history and any concerns you have.  Insert an IV tube into one of your veins. This is used to give you fluids and medicines.  Check your blood pressure, pulse, temperature, and heart rate (vital signs).  Check whether your bag of water (amniotic sac) has broken (ruptured).  Talk with you about your birth plan and discuss pain control options.  Monitoring Your health care provider may monitor your contractions (uterine monitoring) and your baby's heart rate (fetal monitoring). You may need to be monitored:  Often, but not continuously (intermittently).  All the time or for long periods at a time (continuously). Continuous monitoring may be needed if: ? You are taking certain medicines, such as medicine to relieve pain or make your contractions stronger. ? You have pregnancy or labor complications.  Monitoring may be done by:  Placing a special stethoscope or a handheld monitoring device on your abdomen to   check your baby's heartbeat, and feeling your abdomen for contractions. This method of monitoring does not continuously record your baby's heartbeat or your contractions.  Placing monitors on your abdomen (external monitors) to record your baby's heartbeat and the frequency and length of contractions. You may not have to wear external monitors all the time.  Placing monitors inside of your uterus  (internal monitors) to record your baby's heartbeat and the frequency, length, and strength of your contractions. ? Your health care provider may use internal monitors if he or she needs more information about the strength of your contractions or your baby's heart rate. ? Internal monitors are put in place by passing a thin, flexible wire through your vagina and into your uterus. Depending on the type of monitor, it may remain in your uterus or on your baby's head until birth. ? Your health care provider will discuss the benefits and risks of internal monitoring with you and will ask for your permission before inserting the monitors.  Telemetry. This is a type of continuous monitoring that can be done with external or internal monitors. Instead of having to stay in bed, you are able to move around during telemetry. Ask your health care provider if telemetry is an option for you.  Physical exam Your health care provider may perform a physical exam. This may include:  Checking whether your baby is positioned: ? With the head toward your vagina (head-down). This is most common. ? With the head toward the top of your uterus (head-up or breech). If your baby is in a breech position, your health care provider may try to turn your baby to a head-down position so you can deliver vaginally. If it does not seem that your baby can be born vaginally, your provider may recommend surgery to deliver your baby. In rare cases, you may be able to deliver vaginally if your baby is head-up (breech delivery). ? Lying sideways (transverse). Babies that are lying sideways cannot be delivered vaginally.  Checking your cervix to determine: ? Whether it is thinning out (effacing). ? Whether it is opening up (dilating). ? How low your baby has moved into your birth canal.  What are the three stages of labor and delivery?  Normal labor and delivery is divided into the following three stages: Stage 1  Stage 1 is the  longest stage of labor, and it can last for hours or days. Stage 1 includes: ? Early labor. This is when contractions may be irregular, or regular and mild. Generally, early labor contractions are more than 10 minutes apart. ? Active labor. This is when contractions get longer, more regular, more frequent, and more intense. ? The transition phase. This is when contractions happen very close together, are very intense, and may last longer than during any other part of labor.  Contractions generally feel mild, infrequent, and irregular at first. They get stronger, more frequent (about every 2-3 minutes), and more regular as you progress from early labor through active labor and transition.  Many women progress through stage 1 naturally, but you may need help to continue making progress. If this happens, your health care provider may talk with you about: ? Rupturing your amniotic sac if it has not ruptured yet. ? Giving you medicine to help make your contractions stronger and more frequent.  Stage 1 ends when your cervix is completely dilated to 4 inches (10 cm) and completely effaced. This happens at the end of the transition phase. Stage 2  Once   your cervix is completely effaced and dilated to 4 inches (10 cm), you may start to feel an urge to push. It is common for the body to naturally take a rest before feeling the urge to push, especially if you received an epidural or certain other pain medicines. This rest period may last for up to 1-2 hours, depending on your unique labor experience.  During stage 2, contractions are generally less painful, because pushing helps relieve contraction pain. Instead of contraction pain, you may feel stretching and burning pain, especially when the widest part of your baby's head passes through the vaginal opening (crowning).  Your health care provider will closely monitor your pushing progress and your baby's progress through the vagina during stage 2.  Your  health care provider may massage the area of skin between your vaginal opening and anus (perineum) or apply warm compresses to your perineum. This helps it stretch as the baby's head starts to crown, which can help prevent perineal tearing. ? In some cases, an incision may be made in your perineum (episiotomy) to allow the baby to pass through the vaginal opening. An episiotomy helps to make the opening of the vagina larger to allow more room for the baby to fit through.  It is very important to breathe and focus so your health care provider can control the delivery of your baby's head. Your health care provider may have you decrease the intensity of your pushing, to help prevent perineal tearing.  After delivery of your baby's head, the shoulders and the rest of the body generally deliver very quickly and without difficulty.  Once your baby is delivered, the umbilical cord may be cut right away, or this may be delayed for 1-2 minutes, depending on your baby's health. This may vary among health care providers, hospitals, and birth centers.  If you and your baby are healthy enough, your baby may be placed on your chest or abdomen to help maintain the baby's temperature and to help you bond with each other. Some mothers and babies start breastfeeding at this time. Your health care team will dry your baby and help keep your baby warm during this time.  Your baby may need immediate care if he or she: ? Showed signs of distress during labor. ? Has a medical condition. ? Was born too early (prematurely). ? Had a bowel movement before birth (meconium). ? Shows signs of difficulty transitioning from being inside the uterus to being outside of the uterus. If you are planning to breastfeed, your health care team will help you begin a feeding. Stage 3  The third stage of labor starts immediately after the birth of your baby and ends after you deliver the placenta. The placenta is an organ that develops  during pregnancy to provide oxygen and nutrients to your baby in the womb.  Delivering the placenta may require some pushing, and you may have mild contractions. Breastfeeding can stimulate contractions to help you deliver the placenta.  After the placenta is delivered, your uterus should tighten (contract) and become firm. This helps to stop bleeding in your uterus. To help your uterus contract and to control bleeding, your health care provider may: ? Give you medicine by injection, through an IV tube, by mouth, or through your rectum (rectally). ? Massage your abdomen or perform a vaginal exam to remove any blood clots that are left in your uterus. ? Empty your bladder by placing a thin, flexible tube (catheter) into your bladder. ? Encourage   you to breastfeed your baby. After labor is over, you and your baby will be monitored closely to ensure that you are both healthy until you are ready to go home. Your health care team will teach you how to care for yourself and your baby. This information is not intended to replace advice given to you by your health care provider. Make sure you discuss any questions you have with your health care provider. Document Released: 11/19/2007 Document Revised: 08/30/2015 Document Reviewed: 02/24/2015 Elsevier Interactive Patient Education  2018 [employer]  

## 2017-03-04 NOTE — Progress Notes (Signed)
Pt desires cx check.

## 2017-03-04 NOTE — Progress Notes (Signed)
   PRENATAL VISIT NOTE  Subjective:  Tiffany Sullivan is a 27 y.o. G1P0 at [redacted]w[redacted]d being seen today for ongoing prenatal care.  She is currently monitored for the following issues for this high-risk pregnancy and has Obesity (BMI 30.0-34.9); Hidradenitis; Uterine fibroid complicating antenatal care, baby not yet delivered; Syncope and collapse; Supervision of normal first pregnancy, antepartum; and Anemia on their problem list.  Patient reports no complaints.  Contractions: Irritability. Vag. Bleeding: None.  Movement: Present. Denies leaking of fluid.   The following portions of the patient's history were reviewed and updated as appropriate: allergies, current medications, past family history, past medical history, past social history, past surgical history and problem list. Problem list updated.  Objective:   Vitals:   03/04/17 1425  BP: 121/76  Pulse: (!) 119  Weight: 244 lb (110.7 kg)    Fetal Status: Fetal Heart Rate (bpm): 144 Fundal Height: 41 cm Movement: Present  Presentation: Vertex  General:  Alert, oriented and cooperative. Patient is in no acute distress.  Skin: Skin is warm and dry. No rash noted.   Cardiovascular: Normal heart rate noted  Respiratory: Normal respiratory effort, no problems with respiration noted  Abdomen: Soft, gravid, appropriate for gestational age.  Pain/Pressure: Present     Pelvic: Cervical exam performed Dilation: 3 Effacement (%): 50 Station: -3  Extremities: Normal range of motion.  Edema: None  Mental Status:  Normal mood and affect. Normal behavior. Normal judgment and thought content.   Assessment and Plan:  Pregnancy: G1P0 at [redacted]w[redacted]d  1. Supervision of normal first pregnancy, antepartum Scheduled for [place] at MFM today  Term labor symptoms and general obstetric precautions including but not limited to vaginal bleeding, contractions, leaking of fluid and fetal movement were reviewed in detail with the patient. Please refer to After Visit Summary  for other counseling recommendations.  Return in about 5 days (around 03/09/2017).   Emeterio Reeve, MD

## 2017-03-05 ENCOUNTER — Inpatient Hospital Stay (HOSPITAL_COMMUNITY): Payer: 59 | Admitting: Anesthesiology

## 2017-03-05 ENCOUNTER — Encounter (HOSPITAL_COMMUNITY): Payer: Self-pay

## 2017-03-05 ENCOUNTER — Inpatient Hospital Stay (HOSPITAL_COMMUNITY)
Admission: AD | Admit: 2017-03-05 | Discharge: 2017-03-08 | DRG: 806 | Disposition: A | Discharge status: Home / Self Care | Payer: 59 | Source: Ambulatory Visit | Attending: Obstetrics & Gynecology | Admitting: Obstetrics & Gynecology

## 2017-03-05 DIAGNOSIS — Z3A39 39 weeks gestation of pregnancy: Secondary | ICD-10-CM | POA: Diagnosis not present

## 2017-03-05 DIAGNOSIS — O9902 Anemia complicating childbirth: Secondary | ICD-10-CM | POA: Diagnosis present

## 2017-03-05 DIAGNOSIS — O99324 Drug use complicating childbirth: Secondary | ICD-10-CM | POA: Diagnosis present

## 2017-03-05 DIAGNOSIS — D259 Leiomyoma of uterus, unspecified: Secondary | ICD-10-CM | POA: Diagnosis present

## 2017-03-05 DIAGNOSIS — O99214 Obesity complicating childbirth: Secondary | ICD-10-CM | POA: Diagnosis present

## 2017-03-05 DIAGNOSIS — E669 Obesity, unspecified: Secondary | ICD-10-CM | POA: Diagnosis present

## 2017-03-05 DIAGNOSIS — O3413 Maternal care for benign tumor of corpus uteri, third trimester: Secondary | ICD-10-CM | POA: Diagnosis present

## 2017-03-05 DIAGNOSIS — F1210 Cannabis abuse, uncomplicated: Secondary | ICD-10-CM | POA: Diagnosis not present

## 2017-03-05 DIAGNOSIS — O4292 Full-term premature rupture of membranes, unspecified as to length of time between rupture and onset of labor: Principal | ICD-10-CM | POA: Diagnosis present

## 2017-03-05 DIAGNOSIS — Z3400 Encounter for supervision of normal first pregnancy, unspecified trimester: Secondary | ICD-10-CM

## 2017-03-05 DIAGNOSIS — D649 Anemia, unspecified: Secondary | ICD-10-CM | POA: Diagnosis present

## 2017-03-05 DIAGNOSIS — F1290 Cannabis use, unspecified, uncomplicated: Secondary | ICD-10-CM | POA: Diagnosis present

## 2017-03-05 DIAGNOSIS — O4202 Full-term premature rupture of membranes, onset of labor within 24 hours of rupture: Secondary | ICD-10-CM | POA: Diagnosis not present

## 2017-03-05 DIAGNOSIS — Z3490 Encounter for supervision of normal pregnancy, unspecified, unspecified trimester: Secondary | ICD-10-CM

## 2017-03-05 LAB — CBC
HCT: 30.3 % — ABNORMAL LOW (ref 36.0–46.0)
HEMOGLOBIN: 9.1 g/dL — AB (ref 12.0–15.0)
MCH: 21.3 pg — ABNORMAL LOW (ref 26.0–34.0)
MCHC: 30 g/dL (ref 30.0–36.0)
MCV: 70.8 fL — ABNORMAL LOW (ref 78.0–100.0)
Platelets: 202 10*3/uL (ref 150–400)
RBC: 4.28 MIL/uL (ref 3.87–5.11)
RDW: 18 % — ABNORMAL HIGH (ref 11.5–15.5)
WBC: 13.3 10*3/uL — ABNORMAL HIGH (ref 4.0–10.5)

## 2017-03-05 LAB — RAPID URINE DRUG SCREEN, HOSP PERFORMED
Amphetamines: NOT DETECTED
BARBITURATES: NOT DETECTED
BENZODIAZEPINES: NOT DETECTED
COCAINE: NOT DETECTED
OPIATES: NOT DETECTED
Tetrahydrocannabinol: POSITIVE — AB

## 2017-03-05 LAB — POCT FERN TEST: POCT FERN TEST: POSITIVE

## 2017-03-05 LAB — RPR: RPR Ser Ql: NONREACTIVE

## 2017-03-05 LAB — TYPE AND SCREEN
ABO/RH(D): O POS
ANTIBODY SCREEN: NEGATIVE

## 2017-03-05 MED ORDER — LACTATED RINGERS IV SOLN
INTRAVENOUS | Status: DC
  Administered 2017-03-05 (×3): via INTRAVENOUS

## 2017-03-05 MED ORDER — ACETAMINOPHEN 325 MG PO TABS: 650.0000 mg | ORAL_TABLET | ORAL | Status: DC | PRN

## 2017-03-05 MED ORDER — FENTANYL CITRATE (PF) 100 MCG/2ML IJ SOLN: 100.0000 ug | INTRAMUSCULAR | Status: DC | PRN

## 2017-03-05 MED ORDER — LACTATED RINGERS IV SOLN: 500.0000 mL | INTRAVENOUS | Status: DC | PRN

## 2017-03-05 MED ORDER — EPHEDRINE 5 MG/ML INJ
10.0000 mg | INTRAVENOUS | Status: DC | PRN
  Filled 2017-03-05: qty 2

## 2017-03-05 MED ORDER — OXYTOCIN 40 UNITS IN LACTATED RINGERS INFUSION - SIMPLE MED: 2.5000 [IU]/h | INTRAVENOUS | Status: DC

## 2017-03-05 MED ORDER — MISOPROSTOL 50MCG HALF TABLET
50.0000 ug | ORAL_TABLET | ORAL | Status: DC | PRN
  Administered 2017-03-05: 50 ug via ORAL
  Filled 2017-03-05 (×2): qty 1

## 2017-03-05 MED ORDER — OXYTOCIN BOLUS FROM INFUSION
500.0000 mL | Freq: Once | INTRAVENOUS | Status: AC
  Administered 2017-03-06: 500 mL via INTRAVENOUS

## 2017-03-05 MED ORDER — DIPHENHYDRAMINE HCL 50 MG/ML IJ SOLN: 12.5000 mg | INTRAMUSCULAR | Status: DC | PRN

## 2017-03-05 MED ORDER — LIDOCAINE HCL (PF) 1 % IJ SOLN
30.0000 mL | INTRAMUSCULAR | Status: DC | PRN
  Filled 2017-03-05: qty 30

## 2017-03-05 MED ORDER — SOD CITRATE-CITRIC ACID 500-334 MG/5ML PO SOLN: 30.0000 mL | ORAL | Status: DC | PRN

## 2017-03-05 MED ORDER — LACTATED RINGERS IV SOLN
500.0000 mL | Freq: Once | INTRAVENOUS | Status: AC
  Administered 2017-03-05: 500 mL via INTRAVENOUS

## 2017-03-05 MED ORDER — OXYCODONE-ACETAMINOPHEN 5-325 MG PO TABS: 2.0000 | ORAL_TABLET | ORAL | Status: DC | PRN

## 2017-03-05 MED ORDER — PHENYLEPHRINE 40 MCG/ML (10ML) SYRINGE FOR IV PUSH (FOR BLOOD PRESSURE SUPPORT)
80.0000 ug | PREFILLED_SYRINGE | INTRAVENOUS | Status: DC | PRN
  Filled 2017-03-05: qty 5
  Filled 2017-03-05: qty 10

## 2017-03-05 MED ORDER — TERBUTALINE SULFATE 1 MG/ML IJ SOLN
0.2500 mg | Freq: Once | INTRAMUSCULAR | Status: DC | PRN
  Filled 2017-03-05: qty 1

## 2017-03-05 MED ORDER — FENTANYL 2.5 MCG/ML BUPIVACAINE 1/10 % EPIDURAL INFUSION (WH - ANES)
14.0000 mL/h | INTRAMUSCULAR | Status: DC | PRN
  Administered 2017-03-05 (×2): 14 mL/h via EPIDURAL
  Filled 2017-03-05 (×2): qty 100

## 2017-03-05 MED ORDER — OXYTOCIN 40 UNITS IN LACTATED RINGERS INFUSION - SIMPLE MED
1.0000 m[IU]/min | INTRAVENOUS | Status: DC
  Administered 2017-03-05: 2 m[IU]/min via INTRAVENOUS
  Filled 2017-03-05: qty 1000

## 2017-03-05 MED ORDER — LIDOCAINE HCL (PF) 1 % IJ SOLN
INTRAMUSCULAR | Status: DC | PRN
  Administered 2017-03-05: 4 mL via EPIDURAL
  Administered 2017-03-05: 8 mL via EPIDURAL

## 2017-03-05 MED ORDER — PHENYLEPHRINE 40 MCG/ML (10ML) SYRINGE FOR IV PUSH (FOR BLOOD PRESSURE SUPPORT)
80.0000 ug | PREFILLED_SYRINGE | INTRAVENOUS | Status: DC | PRN
  Filled 2017-03-05: qty 5

## 2017-03-05 MED ORDER — OXYCODONE-ACETAMINOPHEN 5-325 MG PO TABS: 1.0000 | ORAL_TABLET | ORAL | Status: DC | PRN

## 2017-03-05 MED ORDER — ONDANSETRON HCL 4 MG/2ML IJ SOLN
4.0000 mg | Freq: Four times a day (QID) | INTRAMUSCULAR | Status: DC | PRN
  Administered 2017-03-05: 4 mg via INTRAVENOUS
  Filled 2017-03-05: qty 2

## 2017-03-05 NOTE — MAU Provider Note (Signed)
Asked to do bedside US to verify vertex Vertex is identified as presenting part Saggital suture identified  FHR 145-150 with small 10-beat accels Average variability Unable to quantify NST due to limited tracing  Seabron Spates, CNM

## 2017-03-05 NOTE — MAU Note (Signed)
Pt presents to MAU c/o SROM @0130  pt reports clear fluid. Pt reports good FM. No bleeding. Pt is having irregular ctx

## 2017-03-05 NOTE — Anesthesia Pain Management Evaluation Note (Signed)
  CRNA Pain Management Visit Note  Patient: Tiffany Sullivan, 27 y.o., female  "Hello I am a member of the anesthesia team at [facility]. We have an anesthesia team available at all times to provide care throughout the hospital, including epidural management and anesthesia for C-section. I don't know your plan for the delivery whether it a natural birth, water birth, IV sedation, nitrous supplementation, doula or epidural, but we want to meet your pain goals."   1.Was your pain managed to your expectations on prior hospitalizations?   No prior hospitalizations  2.What is your expectation for pain management during this hospitalization?     Epidural  3.How can we help you reach that goal?   Record the patient's initial score and the patient's pain goal.   Pain: 3  Pain Goal: 8 [facility] wants you to be able to say your pain was always managed very well.  Jabier Mutton 03/05/2017

## 2017-03-05 NOTE — H&P (Signed)
LABOR AND DELIVERY ADMISSION HISTORY AND PHYSICAL NOTE  Tiffany Sullivan is a 27 y.o. female G1P0 with IUP at [redacted]w[redacted]d by early [place] presenting for SROM @130am .  She reports positive fetal movement. She denies vaginal bleeding.  Prenatal History/Complications: PNC at [facility] Pregnancy complications:  - [place] positive on 01/05/17 - anemia - uterine fibroid - obesity   Past Medical History: Past Medical History:  Diagnosis Date  . Allergy   . Anemia   . Fibroid    1 small located on top of ovary  . GERD (gastroesophageal reflux disease)   . Syncope and collapse     Past Surgical History: Past Surgical History:  Procedure Laterality Date  . NO PAST SURGERIES      Obstetrical History: OB History    Gravida Para Term Preterm AB Living   1 0           SAB TAB Ectopic Multiple Live Births                  Social History: Social History   Socioeconomic History  . Marital status: Single    Spouse name: n/a  . Number of children: 0  . Years of education: None  . Highest education level: None  Social Needs  . Financial resource strain: None  . Food insecurity - worry: None  . Food insecurity - inability: None  . Transportation needs - medical: None  . Transportation needs - non-medical: None  Occupational History  . Occupation: Conservation officer, historic buildings: [place]  Tobacco Use  . Smoking status: Never Smoker  . Smokeless tobacco: Never Used  Substance and Sexual Activity  . Alcohol use: No    Alcohol/week: 0.0 oz    Frequency: Never    Comment: none since Monday  . Drug use: Yes    Types: Marijuana    Comment: stopped when found out pregnant  . Sexual activity: Yes    Partners: Male    Birth control/protection: Pill  Other Topics Concern  . None  Social History Narrative   Lives with 3 roommates. Family lives in [place], [place].  She lives in [place], [place]    Family History: Family History  Problem Relation Age of Onset  . Hypertension Mother      Allergies: No Known Allergies  Medications Prior to Admission  Medication Sig Dispense Refill Last Dose  . DICLEGIS 10-10 MG TBEC Take 1 tablet by mouth 2 (two) times daily. 1 tab in AM, 1 tab mid afternoon 2 tabs at bedtime. Max dose 4 tabs daily. (Patient not taking: Reported on 02/25/2017) 100 tablet prn Not Taking  . Prenat-FeCbn-FeAspGl-FA-Omega (OB COMPLETE PETITE) 35-5-1-200 MG CAPS Take 1 tablet by mouth daily. (Patient taking differently: Take 1 tablet at bedtime by mouth. ) 30 capsule 12 Taking     Review of Systems  All systems reviewed and negative except as stated in HPI  Physical Exam Blood pressure 127/70, pulse 75, temperature 98 F (36.7 C), temperature source Oral, resp. rate 16, weight 111.6 kg (246 lb), last menstrual period 06/02/2016, SpO2 100 %. General appearance: alert, oriented, NAD Lungs: normal respiratory effort Heart: regular rate Abdomen: soft, non-tender; gravid, FH appropriate for GA Extremities: No calf swelling or tenderness Fetal monitoring: FHR 140, moderate variability, +accel, -decel  Uterine activity: irregular contractions  Effacement (%): 30  cx exam deferred  Was 2-3cms in office.  Prenatal labs: ABO, Rh: --/--/O POS (01/11 0400) Antibody: NEG (01/11 0400) Rubella: 3.54 (06/19 1125) RPR:  Non Reactive (10/17 1035)  HBsAg: Negative (06/19 1125)  HIV: Non Reactive (10/17 1035)  GC/Chlamydia: Negative GBS: Negative (12/18 1650)  2-hr GTT: 104 Genetic screening:  AFP neg, Mat 21 normal  Anatomy US: normal, female fetus   Prenatal Transfer Tool  Maternal Diabetes: No Genetic Screening: Normal Maternal Ultrasounds/Referrals: Normal Fetal Ultrasounds or other Referrals:  None Maternal Substance Abuse:  Yes:  Type: Marijuana Significant Maternal Medications:  None Significant Maternal Lab Results: Lab values include: Group B Strep negative  Results for orders placed or performed during the hospital encounter of 03/05/17 (from the  past 24 hour(s))  POCT fern test   Collection Time: 03/05/17  3:34 AM  Result Value Ref Range   POCT Fern Test Positive = ruptured amniotic membanes   CBC   Collection Time: 03/05/17  4:00 AM  Result Value Ref Range   WBC 13.3 (H) 4.0 - 10.5 K/uL   RBC 4.28 3.87 - 5.11 MIL/uL   Hemoglobin 9.1 (L) 12.0 - 15.0 g/dL   HCT 30.3 (L) 36.0 - 46.0 %   MCV 70.8 (L) 78.0 - 100.0 fL   MCH 21.3 (L) 26.0 - 34.0 pg   MCHC 30.0 30.0 - 36.0 g/dL   RDW 18.0 (H) 11.5 - 15.5 %   Platelets 202 150 - 400 K/uL  Type and screen [place]   Collection Time: 03/05/17  4:00 AM  Result Value Ref Range   ABO/RH(D) O POS    Antibody Screen NEG    Sample Expiration 03/08/2017     Patient Active Problem List   Diagnosis Date Noted  . Pregnancy 03/05/2017  . Anemia 12/14/2016  . Syncope and collapse 08/11/2016  . Supervision of normal first pregnancy, antepartum 08/11/2016  . Uterine fibroid complicating antenatal care, baby not yet delivered 06/17/2016  . Obesity (BMI 30.0-34.9) 03/13/2015  . Hidradenitis 03/13/2015    Assessment: Laureen Frederic is a 27 y.o. G1P0 at [redacted]w[redacted]d here for SROM @130am    #Labor: discussed expectant management vs IOL w/pt, advised IOL.  Pt requests to wait a few hours so she can take a nap and eat.  Aware of low likelihood of cytotec stimulating active labor, but prefers to wait.  #Pain: Per patient request, epidural  #FWB: Category 1  #ID:  GBS neg  #MOF: bottle  #MOC:POP #Circ:  Outpatient   Caroline More 03/05/2017, 6:05 AM

## 2017-03-05 NOTE — MAU Note (Signed)
Reports fluid leaking since 0130.

## 2017-03-05 NOTE — Anesthesia Procedure Notes (Signed)
Epidural Patient location during procedure: OB Start time: 03/05/2017 1:40 PM End time: 03/05/2017 1:43 PM  Staffing Anesthesiologist: Audry Pili, MD Performed: anesthesiologist   Preanesthetic Checklist Completed: patient identified, pre-op evaluation, timeout performed, IV checked, risks and benefits discussed and monitors and equipment checked  Epidural Patient position: sitting Prep: DuraPrep Patient monitoring: continuous pulse ox and blood pressure Approach: midline Location: L2-L3 Injection technique: LOR saline  Needle:  Needle type: Tuohy  Needle gauge: 17 G Needle length: 9 cm Needle insertion depth: 6 cm Catheter size: 19 Gauge Catheter at skin depth: 11 cm Test dose: negative and Other (1% lidocaine)  Additional Notes Patient identified. Risks including, but not limited to, bleeding, infection, nerve damage, paralysis, inadequate analgesia, blood pressure changes, nausea, vomiting, allergic reaction, postpartum back pain, itching, and headache were discussed. Patient expressed understanding and wished to proceed. Sterile prep and drape, including hand hygiene, mask, and sterile gloves were used. The patient was positioned and the spine was prepped. The skin was anesthetized with lidocaine. No paraesthesia or other complication noted. The patient did not experience any signs of intravascular injection such as tinnitus or metallic taste in mouth, nor signs of intrathecal spread such as rapid motor block. Please see nursing notes for vital signs. The patient tolerated the procedure well.   Renold Don, MDReason for block:procedure for pain

## 2017-03-05 NOTE — Progress Notes (Signed)
Labor Progress Note Fiorella Hanahan is a 27 y.o. G1P0 at [redacted]w[redacted]d presented for SROM @ 0130. S: Patient is uncomfortable with some leg numbness.  O:  BP 137/87   Pulse 87   Temp 98.3 F (36.8 C) (Oral)   Resp 18   Wt 111.6 kg (246 lb)   LMP 06/02/2016   SpO2 100%   BMI 37.96 kg/m  EFM: difficult to asses  CVE: Dilation: 4.5 Effacement (%): 80 Cervical Position: Posterior Station: -2 Presentation: Vertex Exam by:: Mabeline Caras, RN   A&P: 27 y.o. G1P0 [redacted]w[redacted]d or SROM @ 0130. #Labor: Progressing slowly. Will add IUPC and FSE to titrate pitocin gtt. #Pain: epidural #FWB: added IUPC and FSE #GBS negative  Larwance Rote, MD 4:42 PM

## 2017-03-05 NOTE — Anesthesia Preprocedure Evaluation (Signed)
Anesthesia Evaluation  Patient identified by MRN, date of birth, ID band Patient awake    Reviewed: Allergy & Precautions, NPO status , Patient's Chart, lab work & pertinent test results  Airway Mallampati: II  TM Distance: >3 FB Neck ROM: Full    Dental  (+) Dental Advisory Given   Pulmonary neg pulmonary ROS,    Pulmonary exam normal breath sounds clear to auscultation       Cardiovascular negative cardio ROS Normal cardiovascular exam Rhythm:Regular Rate:Normal     Neuro/Psych negative neurological ROS  negative psych ROS   GI/Hepatic GERD  ,(+)     substance abuse  marijuana use,   Endo/Other  negative endocrine ROSObesity  Renal/GU negative Renal ROS  negative genitourinary   Musculoskeletal negative musculoskeletal ROS (+)   Abdominal   Peds  Hematology  (+) anemia ,   Anesthesia Other Findings   Reproductive/Obstetrics (+) Pregnancy                             Anesthesia Physical Anesthesia Plan  ASA: II  Anesthesia Plan: Epidural   Post-op Pain Management:    Induction:   PONV Risk Score and Plan:   Airway Management Planned: Natural Airway  Additional Equipment:   Intra-op Plan:   Post-operative Plan:   Informed Consent: I have reviewed the patients History and Physical, chart, labs and discussed the procedure including the risks, benefits and alternatives for the proposed anesthesia with the patient or authorized representative who has indicated his/her understanding and acceptance.     Plan Discussed with:   Anesthesia Plan Comments: (Labs reviewed. Platelets acceptable, patient not taking any blood thinning medications. Risks and benefits discussed with patient, patient expressed understanding and wished to proceed.)        Anesthesia Quick Evaluation

## 2017-03-05 NOTE — MAU Note (Signed)
Presentation verified Vertex by bedside US performed by Hansel Feinstein CNM

## 2017-03-05 NOTE — Progress Notes (Signed)
Patient ID: Tiffany Sullivan, female   DOB: 1991/01/21, 27 y.o.   MRN: [id number]  Having abd pain L>R, but doing better since PCA was pushed  BP 121/69, other VSS FHR 130s, +accels, occ variables, +STV Ctx irreg 2-6 mins, Pit @ 31mu/min Cx 9/C/0; IUPC removed as it was almost out  IUP@term  Active labor  Will hold Pit unless ctx space out Plan to check cx in 2 hrs or sooner prn  Serita Grammes CNM 03/05/2017 10:42 PM

## 2017-03-06 ENCOUNTER — Encounter (HOSPITAL_COMMUNITY): Payer: Self-pay

## 2017-03-06 DIAGNOSIS — F1210 Cannabis abuse, uncomplicated: Secondary | ICD-10-CM

## 2017-03-06 DIAGNOSIS — O99324 Drug use complicating childbirth: Secondary | ICD-10-CM

## 2017-03-06 DIAGNOSIS — O3413 Maternal care for benign tumor of corpus uteri, third trimester: Secondary | ICD-10-CM

## 2017-03-06 DIAGNOSIS — D259 Leiomyoma of uterus, unspecified: Secondary | ICD-10-CM

## 2017-03-06 DIAGNOSIS — Z3A39 39 weeks gestation of pregnancy: Secondary | ICD-10-CM

## 2017-03-06 DIAGNOSIS — O4202 Full-term premature rupture of membranes, onset of labor within 24 hours of rupture: Secondary | ICD-10-CM

## 2017-03-06 MED ORDER — SENNOSIDES-DOCUSATE SODIUM 8.6-50 MG PO TABS
2.0000 | ORAL_TABLET | ORAL | Status: DC
  Administered 2017-03-06 – 2017-03-07 (×2): 2 via ORAL
  Filled 2017-03-06 (×2): qty 2

## 2017-03-06 MED ORDER — DIPHENHYDRAMINE HCL 25 MG PO CAPS: 25.0000 mg | ORAL_CAPSULE | Freq: Four times a day (QID) | ORAL | Status: DC | PRN

## 2017-03-06 MED ORDER — SIMETHICONE 80 MG PO CHEW: 80.0000 mg | CHEWABLE_TABLET | ORAL | Status: DC | PRN

## 2017-03-06 MED ORDER — ACETAMINOPHEN 325 MG PO TABS: 650.0000 mg | ORAL_TABLET | ORAL | Status: DC | PRN

## 2017-03-06 MED ORDER — ONDANSETRON HCL 4 MG PO TABS: 4.0000 mg | ORAL_TABLET | ORAL | Status: DC | PRN

## 2017-03-06 MED ORDER — PRENATAL MULTIVITAMIN CH
1.0000 | ORAL_TABLET | Freq: Every day | ORAL | Status: DC
  Administered 2017-03-06 – 2017-03-07 (×2): 1 via ORAL
  Filled 2017-03-06: qty 1

## 2017-03-06 MED ORDER — BENZOCAINE-MENTHOL 20-0.5 % EX AERO: 1.0000 "application " | INHALATION_SPRAY | CUTANEOUS | Status: DC | PRN

## 2017-03-06 MED ORDER — ONDANSETRON HCL 4 MG/2ML IJ SOLN: 4.0000 mg | INTRAMUSCULAR | Status: DC | PRN

## 2017-03-06 MED ORDER — DIBUCAINE 1 % RE OINT: 1.0000 "application " | TOPICAL_OINTMENT | RECTAL | Status: DC | PRN

## 2017-03-06 MED ORDER — WITCH HAZEL-GLYCERIN EX PADS: 1.0000 "application " | MEDICATED_PAD | CUTANEOUS | Status: DC | PRN

## 2017-03-06 MED ORDER — COCONUT OIL OIL
1.0000 "application " | TOPICAL_OIL | Status: DC | PRN
  Filled 2017-03-06: qty 120

## 2017-03-06 MED ORDER — ZOLPIDEM TARTRATE 5 MG PO TABS: 5.0000 mg | ORAL_TABLET | Freq: Every evening | ORAL | Status: DC | PRN

## 2017-03-06 MED ORDER — TETANUS-DIPHTH-ACELL PERTUSSIS 5-2.5-18.5 LF-MCG/0.5 IM SUSP: 0.5000 mL | Freq: Once | INTRAMUSCULAR | Status: DC

## 2017-03-06 MED ORDER — IBUPROFEN 600 MG PO TABS
600.0000 mg | ORAL_TABLET | Freq: Four times a day (QID) | ORAL | Status: DC
  Administered 2017-03-06 – 2017-03-08 (×9): 600 mg via ORAL
  Filled 2017-03-06 (×9): qty 1

## 2017-03-06 NOTE — Anesthesia Postprocedure Evaluation (Signed)
Anesthesia Post Note  Patient: Tiffany Sullivan  Procedure(s) Performed: AN AD HOC LABOR EPIDURAL     Patient location during evaluation: Mother Baby Anesthesia Type: Epidural Level of consciousness: awake and alert Pain management: pain level controlled Vital Signs Assessment: post-procedure vital signs reviewed and stable Respiratory status: spontaneous breathing, nonlabored ventilation and respiratory function stable Cardiovascular status: stable Postop Assessment: no headache, no backache, epidural receding, patient able to bend at knees, no apparent nausea or vomiting and adequate PO intake Anesthetic complications: no    Last Vitals:  Vitals:   03/06/17 0930 03/06/17 1016  BP: (!) 148/82 129/79  Pulse: 80 73  Resp: 20 18  Temp:    SpO2: 100% 100%    Last Pain:  Vitals:   03/06/17 0930  TempSrc:   PainSc: 4    Pain Goal:                 [employer]

## 2017-03-06 NOTE — Progress Notes (Signed)
Patient ID: Tiffany Sullivan, female   DOB: 06/14/1990, 27 y.o.   MRN: [id number]  Having back pain; abd pain; unsure if having rectal pressure  BP 143/85, other VSS FHR 140s, +accels, occ variable Ctx q 4-6 mins w/ Pit @ 64mu/min Cx C/C/vtx +2  IUP@term  End 1st stage ROM x 24h  Made good progress w/ practice pushes- will continue Anticipate SVD  Serita Grammes CNM 03/06/2017 1:36 AM

## 2017-03-07 MED ORDER — DEXTROMETHORPHAN POLISTIREX ER 30 MG/5ML PO SUER
30.0000 mg | Freq: Once | ORAL | Status: AC
  Administered 2017-03-07: 30 mg via ORAL
  Filled 2017-03-07: qty 5

## 2017-03-07 MED ORDER — FERROUS SULFATE 325 (65 FE) MG PO TABS
325.0000 mg | ORAL_TABLET | Freq: Two times a day (BID) | ORAL | Status: DC
  Administered 2017-03-07 (×2): 325 mg via ORAL
  Filled 2017-03-07 (×2): qty 1

## 2017-03-07 MED ORDER — IBUPROFEN 600 MG PO TABS: 600.0000 mg | ORAL_TABLET | Freq: Four times a day (QID) | ORAL | 0 refills | Status: DC

## 2017-03-07 NOTE — Progress Notes (Signed)
POSTPARTUM PROGRESS NOTE  Post Partum Day 1 Subjective:  Stephan Draughn is a 27 y.o. G1P1001 [redacted]w[redacted]d s/p SVD.  No acute events overnight.  Pt denies problems with ambulating, voiding or po intake.  She denies nausea or vomiting.  Pain is well controlled. Lochia Minimal.   Objective: Blood pressure 121/78, pulse 82, temperature 98.3 F (36.8 C), temperature source Oral, resp. rate 18, weight 111.6 kg (246 lb), last menstrual period 06/02/2016, SpO2 100 %, unknown if currently breastfeeding.  Physical Exam:  General: alert, cooperative and no distress Lochia:normal flow Chest: no respiratory distress Heart:regular rate, distal pulses intact Abdomen: soft, nontender,  Uterine Fundus: firm, appropriately tender DVT Evaluation: No calf swelling or tenderness Extremities: no edema  Recent Labs    03/05/17 0400  HGB 9.1*  HCT 30.3*    Assessment/Plan:  ASSESSMENT: Gowri Suchan is a 27 y.o. G1P1001 [redacted]w[redacted]d s/p SVD.  Plan for discharge tomorrow  Social work consult for positive [facility] Start iron for anemia   LOS: 2 days   Porchia Sinkler MossDO 03/07/2017, 12:36 AM

## 2017-03-07 NOTE — Clinical Social Work Note (Signed)
LCSW consulted due to positive THC test.  LCSW also consulted due to RN notes reflecting MOB was "neurotic".  LCSW met with RN who had taken care of MOB through 2 recent 12 hour shifts (not the RN who had written the note reflecting "neurotic" and RN reported that MOB had anxiety yesterday regarding breast feeding but that she did not witness any "neurotic" behaviors.  RN reported that MOB was able to breast feed now and she did not see any concerns.  RN reported that FOB was "supportive."  LCSW met with MOB at bedside to facilitate psychosocial assessment. LCSW provided MOB with the policy regarding THC and reporting of drug exposed newborns to CPS. MOB was angry with LCSW once she was told about the positive THC test and refused to answer any more questions regarding her home life stating she had everything she needed and family support.    LCSW called and left message for on call [place] worker to report drug exposed newborn.  LCSW will also follow up with Cord results once obtained.

## 2017-03-07 NOTE — Clinical Social Work Note (Signed)
LCSW spoke with Mohammed Kindle of [facility] CPS to report drug exposed newborn.

## 2017-03-07 NOTE — Discharge Summary (Signed)
OB Discharge Summary     Patient Name: Tiffany Sullivan DOB: [birth year] MRN: [id number] Date of admission: 03/05/2017  Delivering MD: Serita Grammes D )  Date of discharge: 03/08/2017    Admitting diagnosis: PROM Intrauterine pregnancy: [redacted]w[redacted]d    Secondary diagnosis:  Active Problems:   Patient Active Problem List   Diagnosis Date Noted  . Pregnancy 03/05/2017  . Anemia 12/14/2016  . Syncope and collapse 08/11/2016  . Supervision of normal first pregnancy, antepartum 08/11/2016  . Uterine fibroid complicating antenatal care, baby not yet delivered 06/17/2016  . Obesity (BMI 30.0-34.9) 03/13/2015  . Hidradenitis 03/13/2015    Additional problems: THC abuse     Discharge diagnosis: Term Pregnancy Delivered                                                                                                Post partum procedures:none  Complications: None  Hospital course:  Induction of Labor With Vaginal Delivery   27 y.o. yo G1P1001 at [redacted]w[redacted]d was admitted to the hospital 03/05/2017 for induction of labor.  Indication for induction: PROM.  Patient had an uncomplicated labor course as follows: Membrane Rupture Time/Date: 1:30 AM ,03/05/2017   Intrapartum Procedures: Episiotomy: None [1]                                         Lacerations:  None [1]  Patient had delivery of a Viable infant.  Information for the patient's newborn:  Angeliah, Wisdom [[id number]]  Delivery Method: Vag-Spont   03/06/2017  Details of delivery can be found in separate delivery note.  Patient had a routine postpartum course. Patient is discharged home 03/08/17.  Physical exam  Vitals:   03/07/17 1845 03/08/17 0500  BP: 136/81 131/84  Pulse: 85 84  Resp: 18 16  Temp: (!) 97.5 F (36.4 C) 98.2 F (36.8 C)  SpO2:  99%    General: alert, cooperative and no distress Lochia: appropriate Uterine Fundus: firm Incision: N/A DVT Evaluation: No evidence of DVT seen on physical exam.  Labs: No results found  for this or any previous visit (from the past 24 hour(s)).   Discharge instruction: per After Visit Summary and "Baby and Me Booklet".  After visit meds:  No Known Allergies  Allergies as of 03/08/2017   No Known Allergies     Medication List    TAKE these medications   acetaminophen 500 MG tablet Commonly known as:  TYLENOL Take 500 mg by mouth every 6 (six) hours as needed for moderate pain or headache.   ibuprofen 600 MG tablet Commonly known as:  ADVIL,MOTRIN Take 1 tablet (600 mg total) by mouth every 6 (six) hours.   OB COMPLETE PETITE 35-5-1-200 MG Caps Take 1 tablet by mouth daily. What changed:  when to take this        Diet: routine diet  Activity: Advance as tolerated. Pelvic rest for 6 weeks.   Outpatient follow up:4 weeks Future Appointments:  Future Appointments  Date Time Provider Department  Center  04/02/2017  9:00 AM Leftwich-Kirby, Kathie Dike, CNM [facility] None    Follow up Appt: No Follow-up on file.     Postpartum contraception: Progesterone only pills  Newborn Data: APGAR (1 MIN): 8   APGAR (5 MINS): 9     Baby Feeding: Bottle Disposition:home with mother. CPS report filed by SW for positive THC in drug screen.  Gardner, DO  03/08/2017

## 2017-03-07 NOTE — Discharge Instructions (Signed)

## 2017-03-08 NOTE — Lactation Note (Addendum)
This note was copied from a baby's chart. Lactation Consultation Note  Patient Name: Tiffany Sullivan [id number] Date: 03/08/2017  baby 48 hours of life and as LC entered the room baby latched with depth, feeding in a consistent  Swallowing pattern and mom comfortable.  Per mom milk is in, denies engorgement and has recently pumped off 40 ml so dad can feed the baby a  Bottle.  LC discussed giving the baby lots of opportunities to feed the baby at the breast to prevent engorgement.  Also to establish milk supply well to have the baby feeds 1st breast / soften well and offer the 2nd breast.  If the baby only feeds 1st breast , release the 2nd breast down to comfort to protect supply.  Sore nipple and engorgement prevention and tx reviewed.  Per mom plans to buy a DEBP at  [employer] ( [employer] ).  Mother informed of post-discharge support and given phone number to the lactation department, including services for phone call assistance; out-patient appointments; and breastfeeding support group. List of other breastfeeding resources in the community given in the handout. Encouraged mother to call for problems or concerns related to breastfeeding. Per mom excited the baby is breast feeding so well and milk is in.  [facility] reviewed doc flow sheets/ WNL for D/C.     Maternal Data    Feeding Feeding Type: Breast Fed Length of feed: 10 min(per mom )  LATCH Score Latch: (mother refused RN to see latch)                 Interventions    Lactation Tools Discussed/Used     Consult Status      [name] 03/08/2017, 12:03 PM

## 2017-03-12 ENCOUNTER — Encounter: Payer: 59 | Admitting: Nurse Practitioner

## 2017-03-17 ENCOUNTER — Telehealth: Payer: Self-pay

## 2017-03-17 NOTE — Telephone Encounter (Signed)
Received TC from [facility] stating pt was c/o burning with urination and low abdominal pain. TC to pt. Pt confirmed these sx's. Appt made tomorrow at 11 am. Pt aware if sx's worsen prior to appt, contact the office or call MAU if after hours.

## 2017-03-18 ENCOUNTER — Ambulatory Visit: Payer: 59

## 2017-04-02 ENCOUNTER — Encounter: Payer: Self-pay | Admitting: Advanced Practice Midwife

## 2017-04-02 ENCOUNTER — Ambulatory Visit (INDEPENDENT_AMBULATORY_CARE_PROVIDER_SITE_OTHER): Payer: 59 | Admitting: Advanced Practice Midwife

## 2017-04-02 DIAGNOSIS — Z3009 Encounter for other general counseling and advice on contraception: Secondary | ICD-10-CM

## 2017-04-02 DIAGNOSIS — O3410 Maternal care for benign tumor of corpus uteri, unspecified trimester: Secondary | ICD-10-CM

## 2017-04-02 DIAGNOSIS — D259 Leiomyoma of uterus, unspecified: Secondary | ICD-10-CM

## 2017-04-02 MED ORDER — NORGESTIMATE-ETH ESTRADIOL 0.25-35 MG-MCG PO TABS: 1.0000 | ORAL_TABLET | Freq: Every day | ORAL | 11 refills | Status: DC

## 2017-04-02 NOTE — Patient Instructions (Signed)
Oral Contraception Information Oral contraceptive pills (OCPs) are medicines taken to prevent pregnancy. OCPs work by preventing the ovaries from releasing eggs. The hormones in OCPs also cause the cervical mucus to thicken, preventing the sperm from entering the uterus. The hormones also cause the uterine lining to become thin, not allowing a fertilized egg to attach to the inside of the uterus. OCPs are highly effective when taken exactly as prescribed. However, OCPs do not prevent sexually transmitted diseases (STDs). Safe sex practices, such as using condoms along with the pill, can help prevent STDs. Before taking the pill, you may have a physical exam and Pap test. Your health care provider may order blood tests. The health care provider will make sure you are a good candidate for oral contraception. Discuss with your health care provider the possible side effects of the OCP you may be prescribed. When starting an OCP, it can take 2 to 3 months for the body to adjust to the changes in hormone levels in your body. Types of oral contraception  The combination pill-This pill contains estrogen and progestin (synthetic progesterone) hormones. The combination pill comes in 21-day, 28-day, or 91-day packs. Some types of combination pills are meant to be taken continuously (365-day pills). With 21-day packs, you do not take pills for 7 days after the last pill. With 28-day packs, the pill is taken every day. The last 7 pills are without hormones. Certain types of pills have more than 21 hormone-containing pills. With 91-day packs, the first 84 pills contain both hormones, and the last 7 pills contain no hormones or contain estrogen only.  The minipill-This pill contains the progesterone hormone only. The pill is taken every day continuously. It is very important to take the pill at the same time each day. The minipill comes in packs of 28 pills. All 28 pills contain the hormone. Advantages of oral  contraceptive pills  Decreases premenstrual symptoms.  Treats menstrual period cramps.  Regulates the menstrual cycle.  Decreases a heavy menstrual flow.  May treatacne, depending on the type of pill.  Treats abnormal uterine bleeding.  Treats polycystic ovarian syndrome.  Treats endometriosis.  Can be used as emergency contraception. Things that can make oral contraceptive pills less effective OCPs can be less effective if:  You forget to take the pill at the same time every day.  You have a stomach or intestinal disease that lessens the absorption of the pill.  You take OCPs with other medicines that make OCPs less effective, such as antibiotics, certain HIV medicines, and some seizure medicines.  You take expired OCPs.  You forget to restart the pill on day 7, when using the packs of 21 pills.  Risks associated with oral contraceptive pills Oral contraceptive pills can sometimes cause side effects, such as:  Headache.  Nausea.  Breast tenderness.  Irregular bleeding or spotting.  Combination pills are also associated with a small increased risk of:  Blood clots.  Heart attack.  Stroke.  This information is not intended to replace advice given to you by your health care provider. Make sure you discuss any questions you have with your health care provider. Document Released: 05/02/2002 Document Revised: 07/18/2015 Document Reviewed: 07/31/2012 Elsevier Interactive Patient Education  2018 [employer]  

## 2017-04-02 NOTE — Progress Notes (Signed)
Post Partum Exam  Tiffany Sullivan is a 27 y.o. [id number] female who presents for a postpartum visit. She is 4 weeks postpartum following a spontaneous vaginal delivery. I have fully reviewed the prenatal and intrapartum course. The delivery was at 31 gestational weeks.  Anesthesia: epidural. Postpartum course has been good. Baby's course has been good. Baby is feeding by bottle - Enfamil AR. Bleeding staining only. Bowel function is normal. Bladder function is normal. Patient is not sexually active. Contraception method is none. Postpartum depression screening:neg  The following portions of the patient's history were reviewed and updated as appropriate: allergies, current medications, past family history, past medical history, past social history, past surgical history and problem list.  Last pap smear done 08/11/16 and was Normal  Review of Systems Pertinent items noted in HPI and remainder of comprehensive ROS otherwise negative.    Objective:  Blood pressure 122/80, pulse 84, temperature (!) 97.5 F (36.4 C), temperature source Oral, weight 216 lb 9.6 oz (98.2 kg), last menstrual period 06/02/2016, not currently breastfeeding.  VS reviewed, nursing note reviewed,  Constitutional: well developed, well nourished, no distress HEENT: normocephalic CV: normal rate Pulm/chest wall: normal effort Abdomen: soft Neuro: alert and oriented x 3 Skin: warm, dry Psych: affect normal   Assessment/Plan:    1. Postpartum care and examination -Pt doing well. No pain or other problems.   -Ok to resume sexual activity when lochia stops --Pt to use condoms until start OCPs --F/U in 1 year  2. Encounter for general counseling and advice on contraceptive management --Discussed LARCs as most effective forms of birth control.  Discussed benefits/risks of other methods.  Pt desires OCPs.  Discussed failure rates of OCPs with regular use. Reviewed risk factors and s/sx of DVT/PE/reasons to seek medical care.   Rx for Sprintec sent to pharmacy. - norgestimate-ethinyl estradiol (ORTHO-CYCLEN,SPRINTEC,PREVIFEM) 0.25-35 MG-MCG tablet; Take 1 tablet by mouth daily.  Dispense: 1 Package; Refill: 11

## 2017-10-28 ENCOUNTER — Ambulatory Visit: Payer: 59 | Admitting: Obstetrics

## 2019-08-10 ENCOUNTER — Emergency Department (HOSPITAL_BASED_OUTPATIENT_CLINIC_OR_DEPARTMENT_OTHER)
Admission: EM | Admit: 2019-08-10 | Discharge: 2019-08-10 | Disposition: A | Discharge status: Home / Self Care | Payer: Medicaid Other | Attending: Emergency Medicine | Admitting: Emergency Medicine

## 2019-08-10 ENCOUNTER — Encounter (HOSPITAL_BASED_OUTPATIENT_CLINIC_OR_DEPARTMENT_OTHER): Payer: Self-pay | Admitting: Emergency Medicine

## 2019-08-10 DIAGNOSIS — D500 Iron deficiency anemia secondary to blood loss (chronic): Secondary | ICD-10-CM | POA: Insufficient documentation

## 2019-08-10 DIAGNOSIS — N3000 Acute cystitis without hematuria: Secondary | ICD-10-CM | POA: Insufficient documentation

## 2019-08-10 DIAGNOSIS — R002 Palpitations: Secondary | ICD-10-CM | POA: Insufficient documentation

## 2019-08-10 LAB — CBC WITH DIFFERENTIAL/PLATELET
Abs Immature Granulocytes: 0.04 10*3/uL (ref 0.00–0.07)
Basophils Absolute: 0.1 10*3/uL (ref 0.0–0.1)
Basophils Relative: 0 %
Eosinophils Absolute: 0.2 10*3/uL (ref 0.0–0.5)
Eosinophils Relative: 2 %
HCT: 37.1 % (ref 36.0–46.0)
Hemoglobin: 10.6 g/dL — ABNORMAL LOW (ref 12.0–15.0)
Immature Granulocytes: 0 %
Lymphocytes Relative: 21 %
Lymphs Abs: 2.3 10*3/uL (ref 0.7–4.0)
MCH: 20.2 pg — ABNORMAL LOW (ref 26.0–34.0)
MCHC: 28.6 g/dL — ABNORMAL LOW (ref 30.0–36.0)
MCV: 70.7 fL — ABNORMAL LOW (ref 80.0–100.0)
Monocytes Absolute: 0.5 10*3/uL (ref 0.1–1.0)
Monocytes Relative: 5 %
Neutro Abs: 8.1 10*3/uL — ABNORMAL HIGH (ref 1.7–7.7)
Neutrophils Relative %: 72 %
Platelets: 371 10*3/uL (ref 150–400)
RBC: 5.25 MIL/uL — ABNORMAL HIGH (ref 3.87–5.11)
RDW: 19.6 % — ABNORMAL HIGH (ref 11.5–15.5)
WBC: 11.2 10*3/uL — ABNORMAL HIGH (ref 4.0–10.5)
nRBC: 0 % (ref 0.0–0.2)

## 2019-08-10 LAB — BASIC METABOLIC PANEL
Anion gap: 11 (ref 5–15)
BUN: 11 mg/dL (ref 6–20)
CO2: 21 mmol/L — ABNORMAL LOW (ref 22–32)
Calcium: 9.2 mg/dL (ref 8.9–10.3)
Chloride: 104 mmol/L (ref 98–111)
Creatinine, Ser: 0.76 mg/dL (ref 0.44–1.00)
GFR calc Af Amer: >60 mL/min (ref >60)
GFR calc non Af Amer: >60 mL/min (ref >60)
Glucose, Bld: 91 mg/dL (ref 70–99)
Potassium: 3.9 mmol/L (ref 3.5–5.1)
Sodium: 136 mmol/L (ref 135–145)

## 2019-08-10 LAB — URINALYSIS, ROUTINE W REFLEX MICROSCOPIC
Bilirubin Urine: NEGATIVE
Glucose, UA: NEGATIVE mg/dL
Ketones, ur: NEGATIVE mg/dL
Nitrite: POSITIVE — AB
Protein, ur: NEGATIVE mg/dL
Specific Gravity, Urine: 1.015 (ref 1.005–1.030)
pH: 6 (ref 5.0–8.0)

## 2019-08-10 LAB — RAPID URINE DRUG SCREEN, HOSP PERFORMED
Amphetamines: NOT DETECTED
Barbiturates: NOT DETECTED
Benzodiazepines: NOT DETECTED
Cocaine: NOT DETECTED
Opiates: NOT DETECTED
Tetrahydrocannabinol: POSITIVE — AB

## 2019-08-10 LAB — TSH: TSH: 0.637 u[IU]/mL (ref 0.350–4.500)

## 2019-08-10 LAB — URINALYSIS, MICROSCOPIC (REFLEX)

## 2019-08-10 LAB — PREGNANCY, URINE: Preg Test, Ur: NEGATIVE

## 2019-08-10 LAB — CBG MONITORING, ED: Glucose-Capillary: 109 mg/dL — ABNORMAL HIGH (ref 70–99)

## 2019-08-10 MED ORDER — FERROUS SULFATE 325 (65 FE) MG PO TABS
325.0000 mg | ORAL_TABLET | Freq: Every day | ORAL | 0 refills | Status: DC
Start: 2019-08-10 — End: 2020-03-18

## 2019-08-10 MED ORDER — SULFAMETHOXAZOLE-TRIMETHOPRIM 800-160 MG PO TABS
1.0000 | ORAL_TABLET | Freq: Once | ORAL | Status: AC
  Administered 2019-08-10: 1 via ORAL
  Filled 2019-08-10: qty 1

## 2019-08-10 MED ORDER — SULFAMETHOXAZOLE-TRIMETHOPRIM 800-160 MG PO TABS: 1.0000 | ORAL_TABLET | Freq: Two times a day (BID) | ORAL | 0 refills | Status: AC

## 2019-08-10 MED ORDER — SODIUM CHLORIDE 0.9 % IV BOLUS: 1000.0000 mL | Freq: Once | INTRAVENOUS | Status: DC

## 2019-08-10 NOTE — ED Notes (Signed)
PIV wouldn't draw back any more blood; attempted second PIV unsuccessfully; pt tolerated well

## 2019-08-10 NOTE — Discharge Instructions (Signed)
Avoid marijuana ?

## 2019-08-10 NOTE — ED Notes (Signed)
EDP at bedside  

## 2019-08-10 NOTE — ED Notes (Signed)
Attempted IV in right antecubital without success, unable to thread catheter. Removed IV in right hand as line had become very painful for patient.

## 2019-08-10 NOTE — ED Notes (Signed)
Difficulty placing IV this evening, EDP made aware. EDP okay with no further attempts at this time as blood was collected successfully.

## 2019-08-10 NOTE — ED Triage Notes (Addendum)
Palpitations started a few hours ago while eating pizza. States she can feel her heart beating in her chest. States she felt like she was going to pass out. Appear anxious

## 2019-08-10 NOTE — ED Provider Notes (Signed)
[facility] EMERGENCY DEPARTMENT Provider Note   CSN: [id number] Arrival date & time: 08/10/19  1744     History Chief Complaint  Patient presents with  . Palpitations    Tiffany Sullivan is a 29 y.o. female.  Pt presents to the ED today with palpitations.  She said she's had these symptoms intermittently for about a month.  She said it started today after she ate.  She said her heart races.  Today, it went up to 109.  Pt said her bp went up to sbp of 149.  She does not feel like she should be anxious.  She said she passed out 1 month ago.  She did go to UC on 6/14 for similar sx.  She was told she was anemic, so purchased some iron pills.  Pt said she's been told she has a fibroid and her periods are heavy.  She has not been to the obgyn since she had her 29 year old.  Pt said she feels intoxicated when these spells occur, but has not been taking anything.        Past Medical History:  Diagnosis Date  . Allergy   . Anemia   . Fibroid    1 small located on top of ovary  . GERD (gastroesophageal reflux disease)   . Syncope and collapse     Patient Active Problem List   Diagnosis Date Noted  . Uterine fibroid 04/02/2017  . Syncope and collapse 08/11/2016  . Obesity (BMI 30.0-34.9) 03/13/2015  . Hidradenitis 03/13/2015    Past Surgical History:  Procedure Laterality Date  . NO PAST SURGERIES       OB History    Gravida  1   Para  1   Term  1   Preterm      AB      Living  1     SAB      TAB      Ectopic      Multiple  0   Live Births  1           Family History  Problem Relation Age of Onset  . Hypertension Mother     Social History   Tobacco Use  . Smoking status: Never Smoker  . Smokeless tobacco: Never Used  Vaping Use  . Vaping Use: Never used  Substance Use Topics  . Alcohol use: No    Alcohol/week: 0.0 standard drinks    Comment: none since Monday  . Drug use: Yes    Types: Marijuana    Comment: stopped when found  out pregnant    Home Medications Prior to Admission medications   Medication Sig Start Date End Date Taking? Authorizing Provider  acetaminophen (TYLENOL) 500 MG tablet Take 500 mg by mouth every 6 (six) hours as needed for moderate pain or headache.    [provider]  ferrous sulfate 325 (65 FE) MG tablet Take 1 tablet (325 mg total) by mouth daily. 08/10/19   Isla Pence, MD  ibuprofen (ADVIL,MOTRIN) 600 MG tablet Take 1 tablet (600 mg total) by mouth every 6 (six) hours. 03/08/17   Moss, Amber, DO  norgestimate-ethinyl estradiol (ORTHO-CYCLEN,SPRINTEC,PREVIFEM) 0.25-35 MG-MCG tablet Take 1 tablet by mouth daily. 04/02/17   Leftwich-Kirby, Kathie Dike, CNM  Prenat-FeCbn-FeAspGl-FA-Omega (OB COMPLETE PETITE) 35-5-1-200 MG CAPS Take 1 tablet by mouth daily. Patient taking differently: Take 1 tablet at bedtime by mouth.  08/11/16   Kandis Cocking A, CNM  sulfamethoxazole-trimethoprim (BACTRIM DS) 800-160 MG  tablet Take 1 tablet by mouth 2 (two) times daily for 7 days. 08/10/19 08/17/19  Isla Pence, MD    Allergies    Patient has no known allergies.  Review of Systems   Review of Systems  Cardiovascular: Positive for palpitations.  All other systems reviewed and are negative.   Physical Exam Updated Vital Signs BP 114/68   Pulse 83   Temp 98.5 F (36.9 C) (Oral)   Resp 19   Ht 5\' 7"  (1.702 m)   Wt 102.1 kg   LMP 07/26/2019   SpO2 100%   BMI 35.24 kg/m   Physical Exam Vitals and nursing note reviewed.  Constitutional:      Appearance: Normal appearance.  HENT:     Head: Normocephalic and atraumatic.     Right Ear: External ear normal.     Left Ear: External ear normal.     Nose: Nose normal.     Mouth/Throat:     Mouth: Mucous membranes are moist.     Pharynx: Oropharynx is clear.  Eyes:     Extraocular Movements: Extraocular movements intact.     Conjunctiva/sclera: Conjunctivae normal.     Pupils: Pupils are equal, round, and reactive to light.    Cardiovascular:     Rate and Rhythm: Normal rate and regular rhythm.     Pulses: Normal pulses.     Heart sounds: Normal heart sounds.  Pulmonary:     Effort: Pulmonary effort is normal.     Breath sounds: Normal breath sounds.  Abdominal:     General: Abdomen is flat. Bowel sounds are normal.     Palpations: Abdomen is soft.  Musculoskeletal:        General: Normal range of motion.     Cervical back: Normal range of motion and neck supple.  Skin:    General: Skin is warm.     Capillary Refill: Capillary refill takes less than 2 seconds.  Neurological:     General: No focal deficit present.     Mental Status: She is alert and oriented to person, place, and time.  Psychiatric:        Mood and Affect: Mood is anxious.     ED Results / Procedures / Treatments   Labs (all labs ordered are listed, but only abnormal results are displayed) Labs Reviewed  BASIC METABOLIC PANEL - Abnormal; Notable for the following components:      Result Value   CO2 21 (*)    All other components within normal limits  CBC WITH DIFFERENTIAL/PLATELET - Abnormal; Notable for the following components:   WBC 11.2 (*)    RBC 5.25 (*)    Hemoglobin 10.6 (*)    MCV 70.7 (*)    MCH 20.2 (*)    MCHC 28.6 (*)    RDW 19.6 (*)    Neutro Abs 8.1 (*)    All other components within normal limits  URINALYSIS, ROUTINE W REFLEX MICROSCOPIC - Abnormal; Notable for the following components:   APPearance CLOUDY (*)    Hgb urine dipstick TRACE (*)    Nitrite POSITIVE (*)    Leukocytes,Ua MODERATE (*)    All other components within normal limits  RAPID URINE DRUG SCREEN, HOSP PERFORMED - Abnormal; Notable for the following components:   Tetrahydrocannabinol POSITIVE (*)    All other components within normal limits  URINALYSIS, MICROSCOPIC (REFLEX) - Abnormal; Notable for the following components:   Bacteria, UA MANY (*)    All other components within  normal limits  CBG MONITORING, ED - Abnormal; Notable for  the following components:   Glucose-Capillary 109 (*)    All other components within normal limits  PREGNANCY, URINE  TSH    EKG EKG Interpretation  Date/Time:  Thursday August 10 2019 17:44:38 EDT Ventricular Rate:  87 PR Interval:  102 QRS Duration: 90 QT Interval:  380 QTC Calculation: 457 R Axis:   47 Text Interpretation: Sinus rhythm with short PR Otherwise normal ECG No significant change since last tracing Confirmed by Isla Pence [phone]) on 08/10/2019 5:59:05 PM   Radiology No results found.  Procedures Procedures (including critical care time)  Medications Ordered in ED Medications  sodium chloride 0.9 % bolus 1,000 mL (0 mLs Intravenous Hold 08/10/19 2031)  sulfamethoxazole-trimethoprim (BACTRIM DS) 800-160 MG per tablet 1 tablet (has no administration in time range)    ED Course  I have reviewed the triage vital signs and the nursing notes.  Pertinent labs & imaging results that were available during my care of the patient were reviewed by me and considered in my medical decision making (see chart for details).    MDM Rules/Calculators/A&P                          Pt is feeling better after some time on the monitor.  No palpitations while here.  Pt does have a UTI.  She will be treated with bactrim.  She is anemic and is given a rx for iron.  She had a nl TSH a few days ago.  Pt's UDS + for MJ, but she said she has not smoked in about a week.  She is trying to give it up.  Pt told to continue to avoid it.  She is told to return if worse.  F/u with gyn.  Final Clinical Impression(s) / ED Diagnoses Final diagnoses:  Palpitations  Acute cystitis without hematuria  Iron deficiency anemia due to chronic blood loss    Rx / DC Orders ED Discharge Orders         Ordered    ferrous sulfate 325 (65 FE) MG tablet  Daily     Discontinue  Reprint     08/10/19 2100    sulfamethoxazole-trimethoprim (BACTRIM DS) 800-160 MG tablet  2 times daily     Discontinue   Reprint     08/10/19 2100           Isla Pence, MD 08/10/19 2103

## 2019-08-25 IMAGING — US US MFM OB DETAIL+14 WK
1 series · 13 of 28 positions shown · non-contrast
Comparison: none

[Series 1: us mfm ob detail+14 wk · 74 acquisitions, 13 frames shown]
[im 3/74]
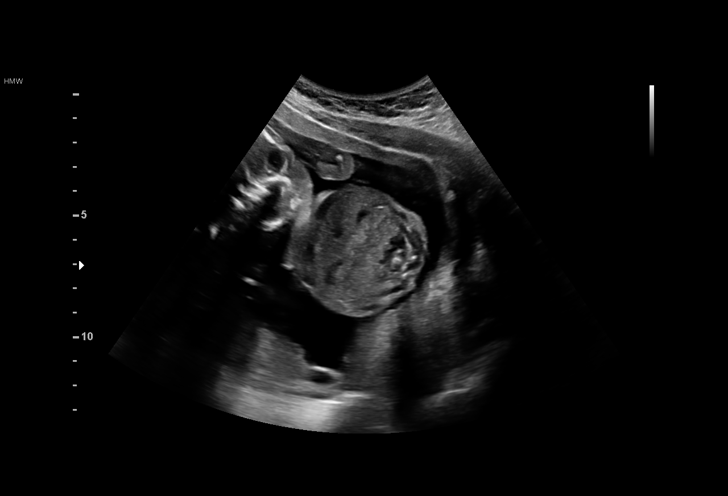
[im 9/74]
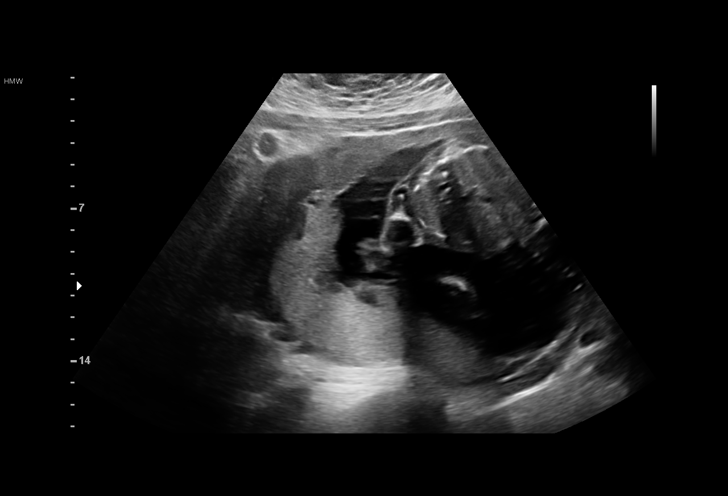
[im 14/74]
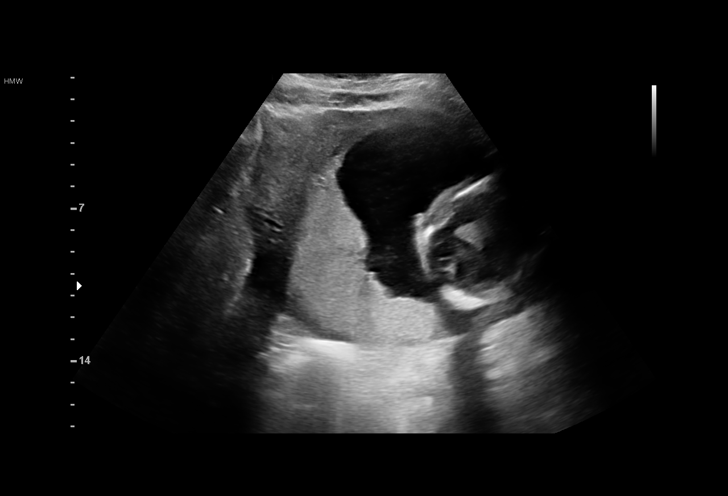
[im 19/74]
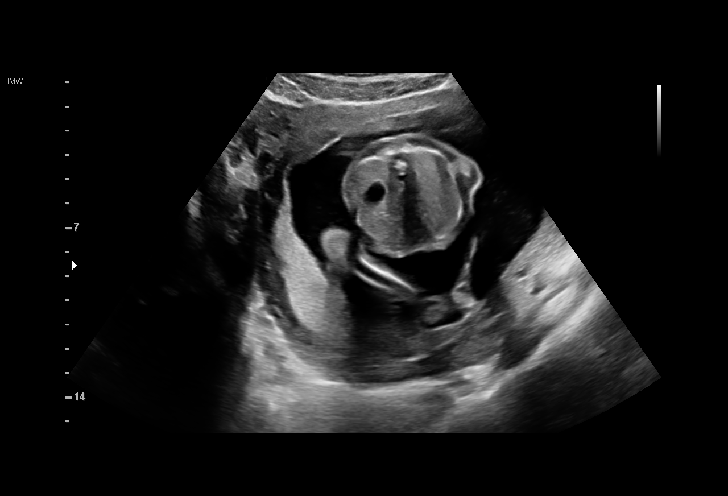
[im 25/74]
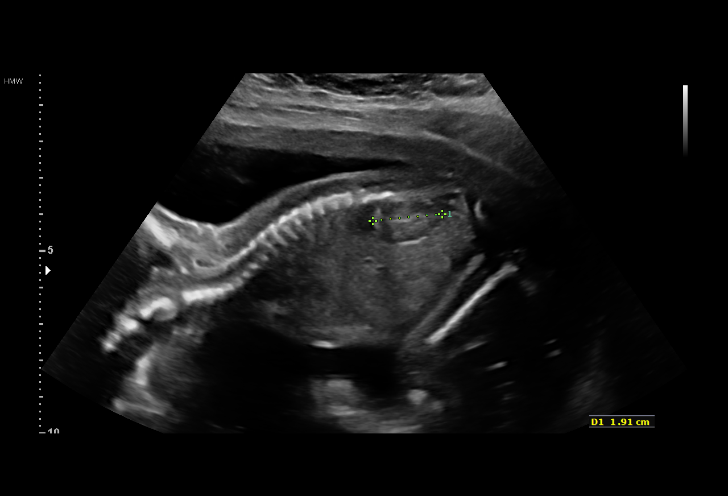
[im 30/74]
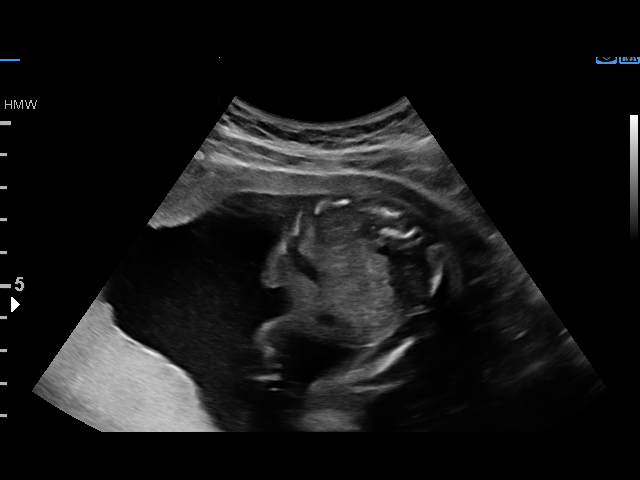
[im 38/74]
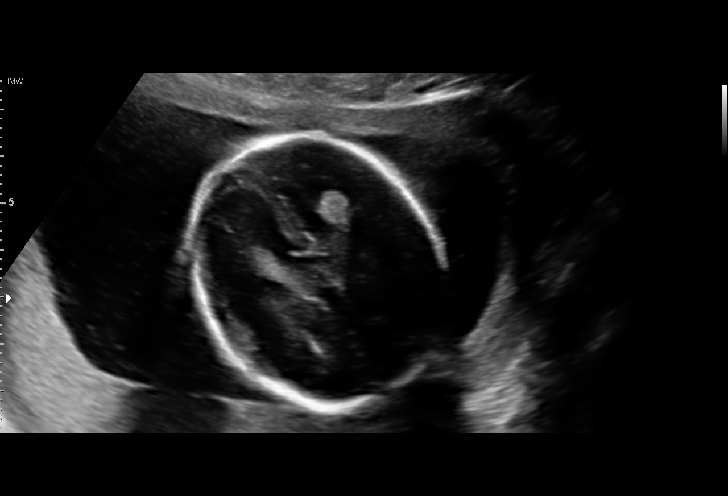
[im 44/74]
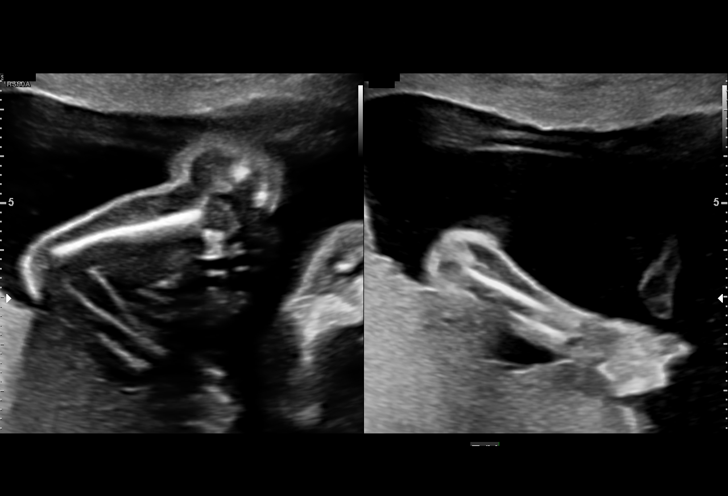
[im 49/74]
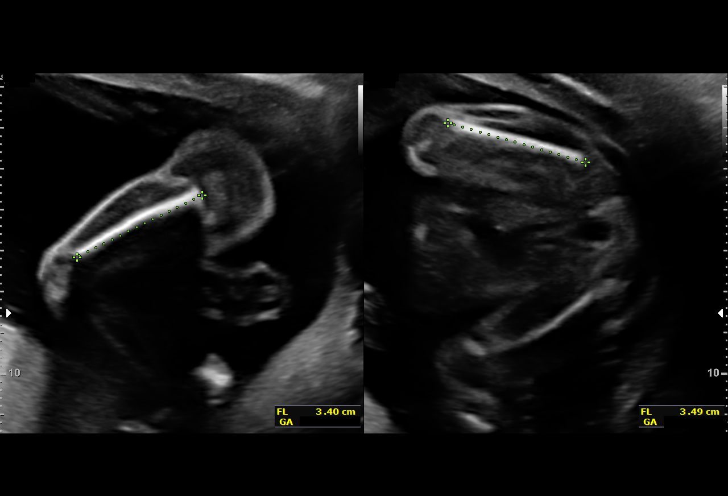
[im 55/74]
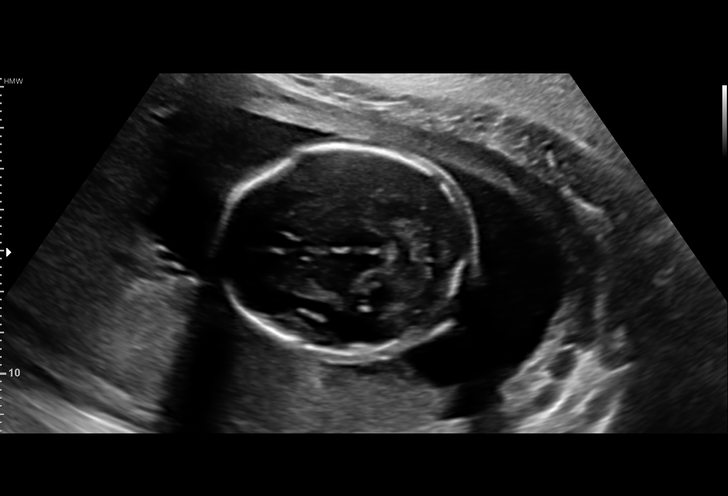
[im 60/74]
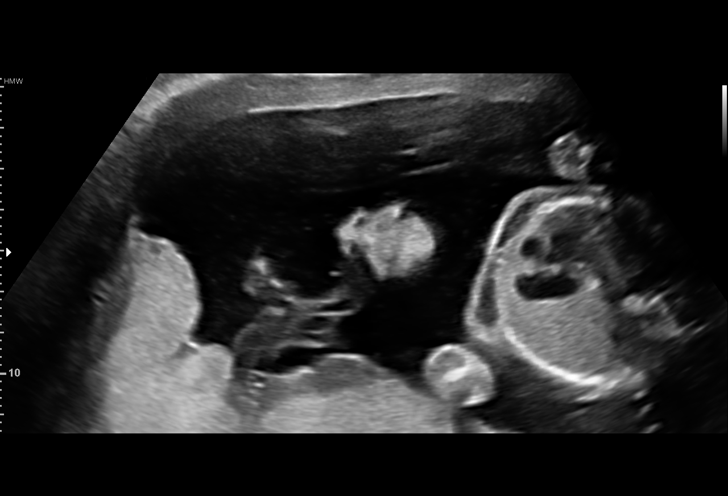
[im 65/74]
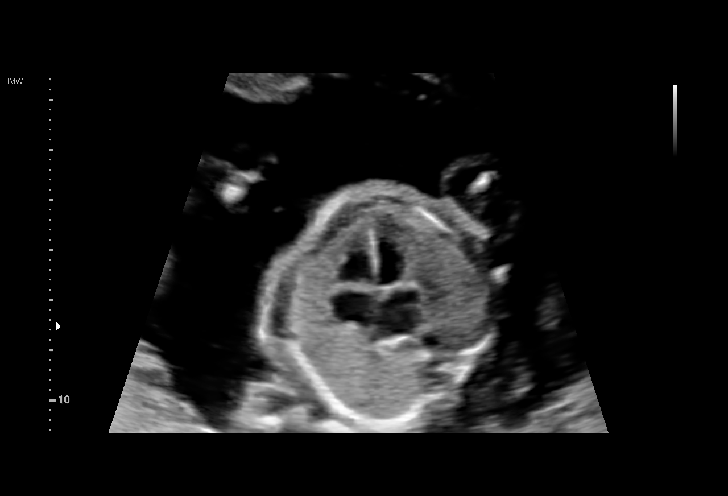
[im 71/74]
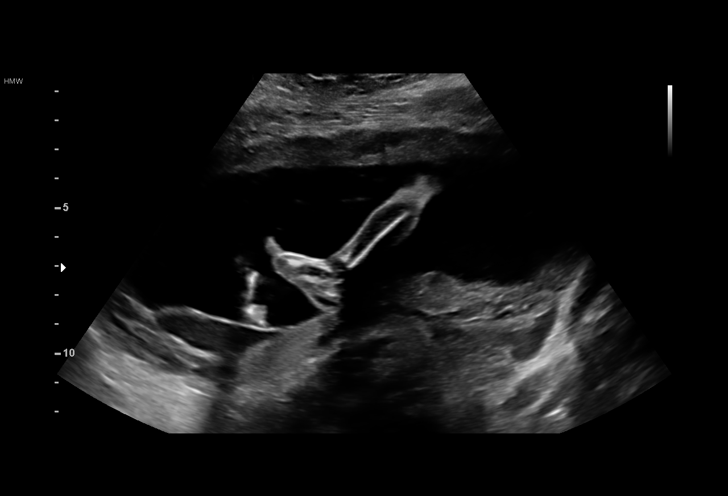

[13 of 28 positions shown; findings below may reference images not displayed]

Road [HOSPITAL]

Indications

20 weeks gestation of pregnancy
Obesity complicating pregnancy, second
trimester (pregravid BMI 35)
Encounter for antenatal screening for
malformations
Uterine fibroids
OB History

Blood Type:            Height:  5'8"   Weight (lb):  236      BMI:
Gravidity:    1         Term:   0        Prem:   0        SAB:   0
TOP:          0       Ectopic:  0        Living: 0
Fetal Evaluation

Num Of Fetuses:     1
Fetal Heart         155
Rate(bpm):
Cardiac Activity:   Observed
Presentation:       Breech
Placenta:           Posterior Fundal, above cervical os
P. Cord Insertion:  Visualized, central

Amniotic Fluid
AFI FV:      Subjectively within normal limits

Largest Pocket(cm)
4.5
Biometry
BPD:      50.4  mm     G. Age:  21w 2d         88  %    CI:        75.74   %   70 - 86
FL/HC:      18.8   %   16.8 -
HC:      183.6  mm     G. Age:  20w 5d         69  %    HC/AC:      1.13       1.09 -
AC:      161.8  mm     G. Age:  21w 2d         79  %    FL/BPD:     68.5   %
FL:       34.5  mm     G. Age:  20w 6d         67  %    FL/AC:      21.3   %   20 - 24
HUM:      34.1  mm     G. Age:  21w 4d         91  %

Est. FW:     396  gm    0 lb 14 oz      61  %
Gestational Age

LMP:           20w 1d       Date:   06/02/16                 EDD:   03/09/17
U/S Today:     21w 0d                                        EDD:   03/03/17
Best:          20w 1d    Det. By:   LMP  (06/02/16)          EDD:   03/09/17
Anatomy

Cranium:               Appears normal         Aortic Arch:            Appears normal
Cavum:                 Appears normal         Ductal Arch:            Appears normal
Ventricles:            Appears normal         Diaphragm:              Appears normal
Choroid Plexus:        Appears normal         Stomach:                Appears normal, left
sided
Cerebellum:            Appears normal         Abdomen:                Appears normal
Posterior Fossa:       Appears normal         Abdominal Wall:         Appears nml (cord
insert, abd wall)
Nuchal Fold:           Not applicable (>20    Cord Vessels:           Appears normal (3
wks GA)                                        vessel cord)
Face:                  Appears normal         Kidneys:                Appear normal
(orbits and profile)
Lips:                  Appears normal         Bladder:                Appears normal
Thoracic:              Appears normal         Spine:                  Appears normal
Heart:                 Appears normal         Upper Extremities:      Appears normal
(4CH, axis, and situs
RVOT:                  Not well visualized    Lower Extremities:      Appears normal
LVOT:                  Not well visualized

Other:  Fetus appears to be a male. Heels visualized. Right 5th digit
visualized. Nasal bone visualized. Technically difficult due to fetal
position.
Cervix Uterus Adnexa

Cervix
Length:            3.3  cm.
Normal appearance by transabdominal scan.

Uterus
No abnormality visualized.

Left Ovary
No adnexal mass visualized.

Right Ovary
No adnexal mass visualized.

Cul De Sac:   No free fluid seen.

Adnexa:       No abnormality visualized.
Myomas

Site                     L(cm)      W(cm)      D(cm)      Location
Fundus                   9          7.6        7.2        Pedunculated

Blood Flow                 RI        PI       Comments

Impression

Singleton intrauterine pregnancy at 20+1 weeks, here for
anatomic survey
Review of the anatomy shows no sonographic markers for
aneuploidy or structural anomalies
However, cardiac evaluation should be considered
suboptimal secondary to fetal position
There is a large fundal fibroid as noted above
Amniotic fluid volume is normal
Estimated fetal weight is 396g which is growth in the 61st
percentile
Recommendations

Recommend repeat scan in 4 weeks to complete anatomic
survey. Periodic evaluations of fetal growth are indicated due
to the patient's uterine fibroids

## 2019-09-28 IMAGING — US US MFM OB FOLLOW-UP
1 series · 13 of 28 positions shown · non-contrast
Comparison: none

[Series 1: us mfm ob follow-up · 13 of 44 slices shown]
[im 2/44]
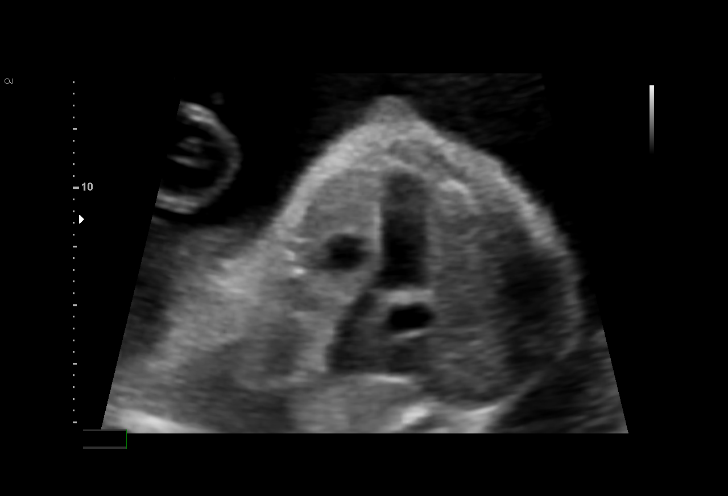
[im 5/44]
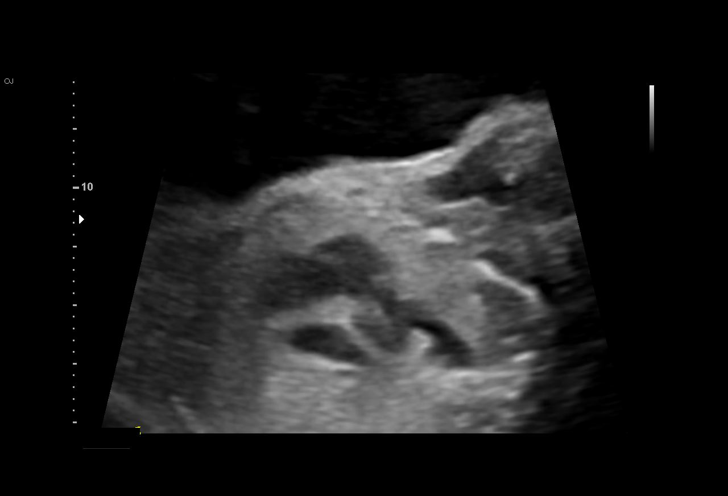
[im 8/44]
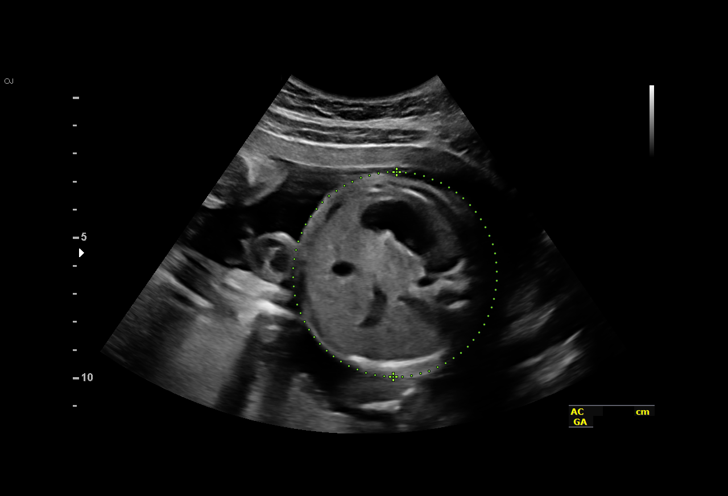
[im 12/44]
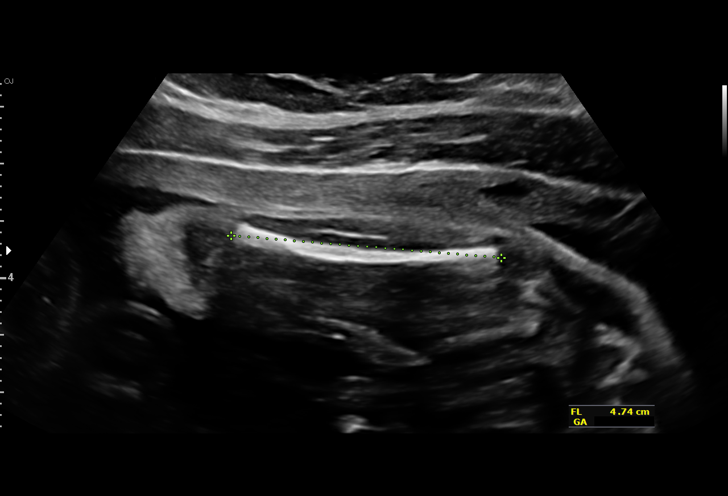
[im 15/44]
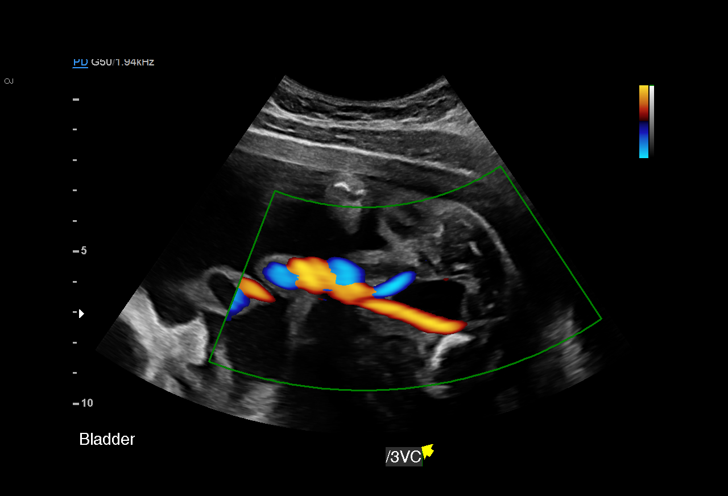
[im 18/44]
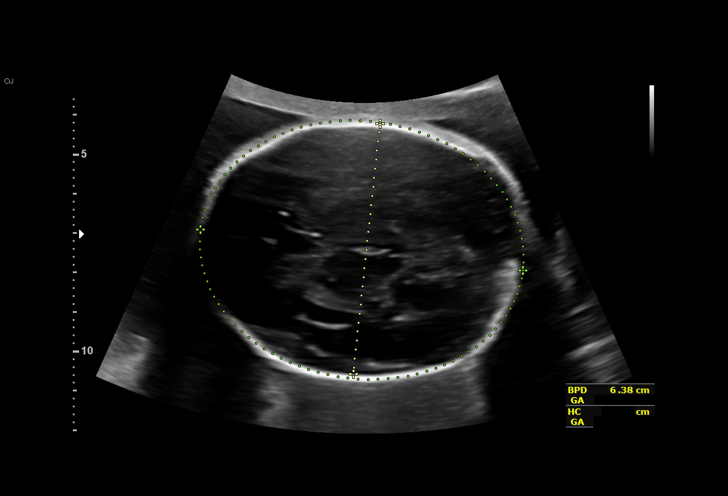
[im 23/44]
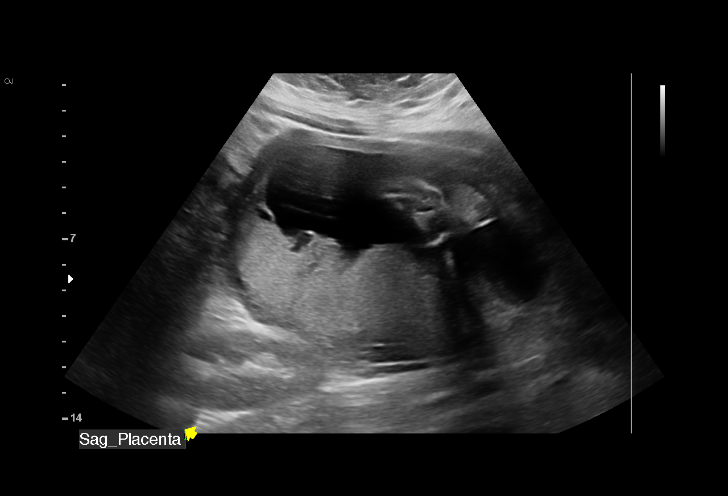
[im 26/44]
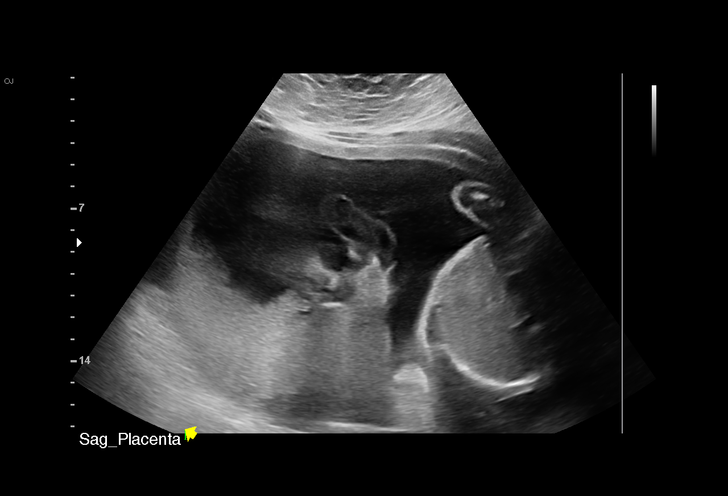
[im 29/44]
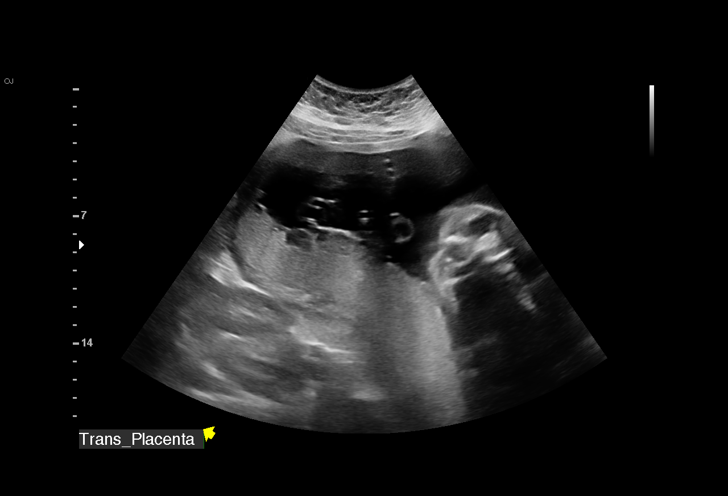
[im 32/44]
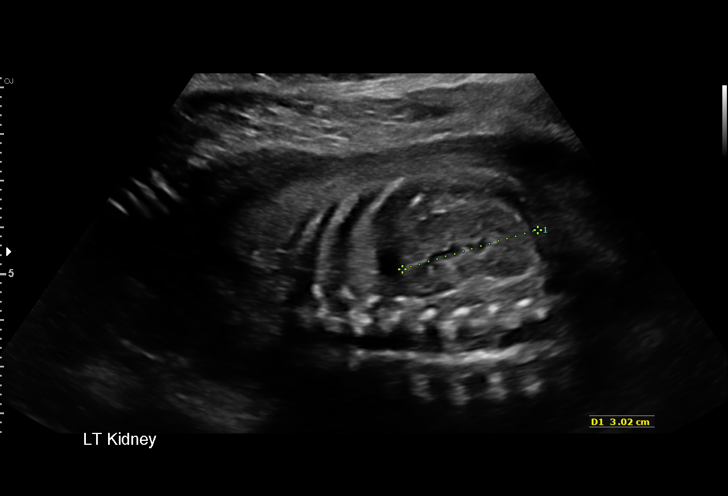
[im 36/44]
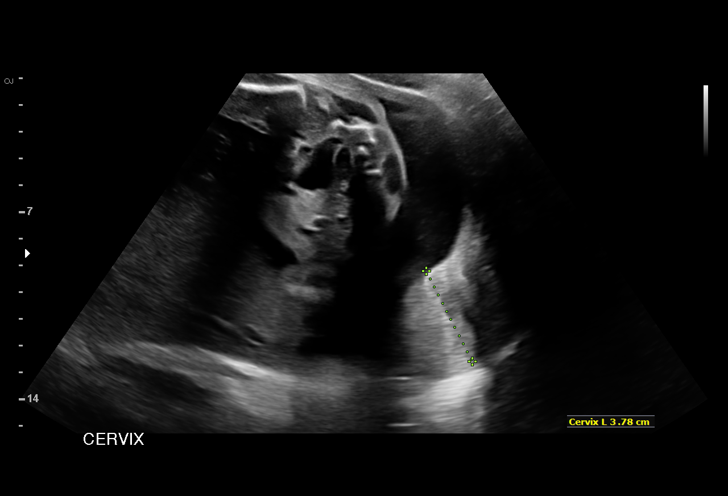
[im 39/44]
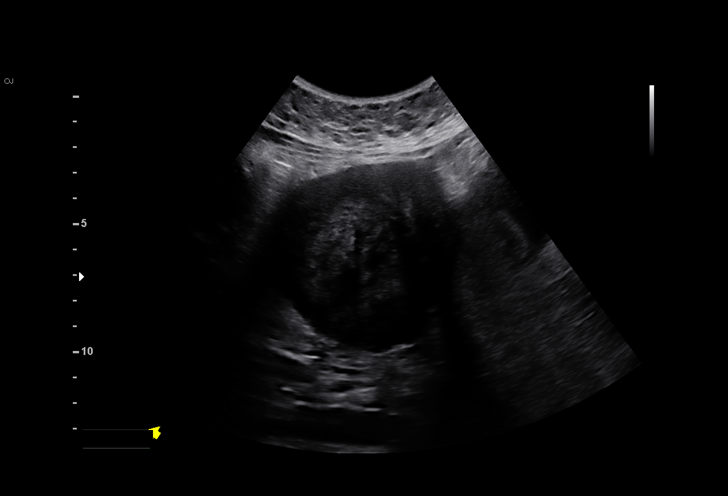
[im 42/44]
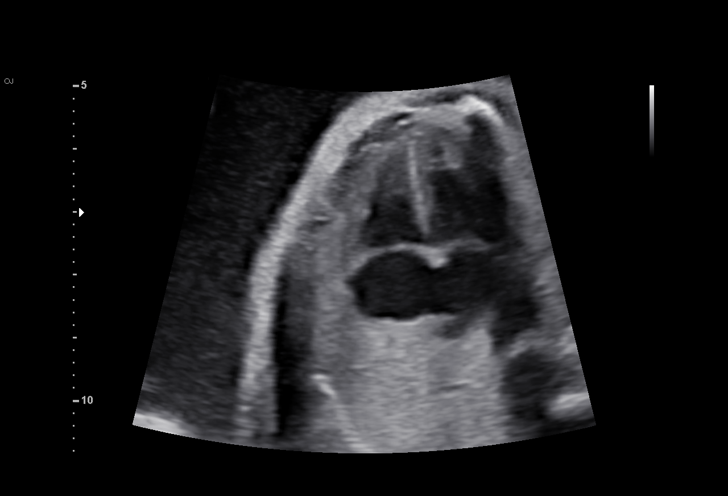

[13 of 28 positions shown; findings below may reference images not displayed]

Road [HOSPITAL]

Indications

25 weeks gestation of pregnancy
Obesity complicating pregnancy, second
trimester (pregravid BMI 35)
Uterine fibroids
Encounter for other antenatal screening
follow-up
OB History

Blood Type:            Height:  5'8"   Weight (lb):  236       BMI:
Gravidity:    1         Term:   0        Prem:   0        SAB:   0
TOP:          0       Ectopic:  0        Living: 0
Fetal Evaluation

Num Of Fetuses:     1
Fetal Heart         153
Rate(bpm):
Cardiac Activity:   Observed
Presentation:       Breech
Placenta:           Posterior, above cervical os
P. Cord Insertion:  Visualized

Amniotic Fluid
AFI FV:      Subjectively within normal limits

Largest Pocket(cm)
6.37
Biometry
BPD:      64.1  mm     G. Age:  26w 0d         74  %    CI:        74.96   %    70 - 86
FL/HC:      20.0   %    18.7 -
HC:      234.9  mm     G. Age:  25w 3d         48  %    HC/AC:      1.03        1.04 -
AC:       227   mm     G. Age:  27w 1d         92  %    FL/BPD:     73.2   %    71 - 87
FL:       46.9  mm     G. Age:  25w 5d         56  %    FL/AC:      20.7   %    20 - 24
CER:      32.5  mm     G. Age:  28w 4d       > 95  %

Est. FW:     925  gm      2 lb 1 oz     77  %
Gestational Age

LMP:           25w 0d        Date:  06/02/16                 EDD:   03/09/17
U/S Today:     26w 1d                                        EDD:   03/01/17
Best:          25w 0d     Det. By:  LMP  (06/02/16)          EDD:   03/09/17
Anatomy

Cranium:               Appears normal         Aortic Arch:            Previously seen
Cavum:                 Appears normal         Ductal Arch:            Previously seen
Ventricles:            Appears normal         Diaphragm:              Appears normal
Choroid Plexus:        Previously seen        Stomach:                Appears normal, left
sided
Cerebellum:            Previously seen        Abdomen:                Appears normal
Posterior Fossa:       Appears normal         Abdominal Wall:         Previously seen
Nuchal Fold:           Not applicable (>20    Cord Vessels:           Appears normal (3
wks GA)                                        vessel cord)
Face:                  Orbits and profile     Kidneys:                Appear normal
previously seen
Lips:                  Previously seen        Bladder:                Appears normal
Thoracic:              Appears normal         Spine:                  Previously seen
Heart:                 Appears normal         Upper Extremities:      Previously seen
(4CH, axis, and situs
RVOT:                  Appears normal         Lower Extremities:      Previously seen
LVOT:                  Appears normal

Other:  Fetus appears to be a male. Heels pre visualized. Right 5th digit
prev.visualized. Nasal bone prev.visualized. Technically difficult due
to fetal position.
Cervix Uterus Adnexa

Cervix
Length:           3.78  cm.
Normal appearance by transabdominal scan.

Uterus
No abnormality visualized.

Left Ovary
Not visualized. No adnexal mass visualized.

Right Ovary
Not visualized. No adnexal mass visualized.

Cul De Sac:   No free fluid seen.

Adnexa:       No abnormality visualized.
Impression

Singleton intrauterine pregnancy at 25 weeks 0 days
gestation with fetal cardiac activity
Breech presentation
Posterior placenta without evidence of previa
Normal appearing fetal growth and amniotic fluid volume
Completion of fetal anatomic survey
Normal appearing cervical length
Recommendations

Periodic evaluations of fetal growth are indicated due to the
patient's uterine fibroids

## 2019-11-08 IMAGING — US US MFM OB FOLLOW-UP
1 series · 13 of 28 positions shown · non-contrast
Comparison: none

[Series 1: us mfm ob follow-up · 46 acquisitions, 13 frames shown]
[im 2/46]
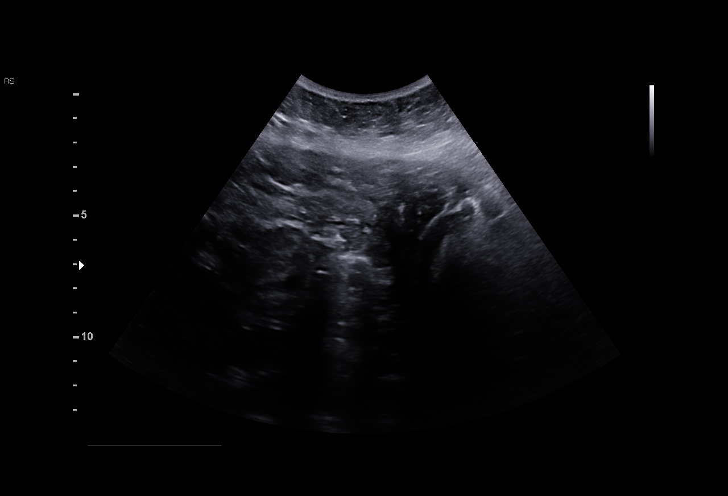
[im 6/46]
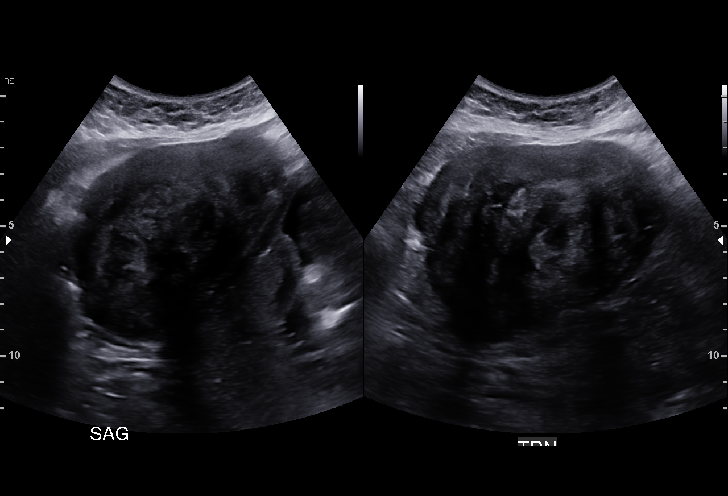
[im 9/46]
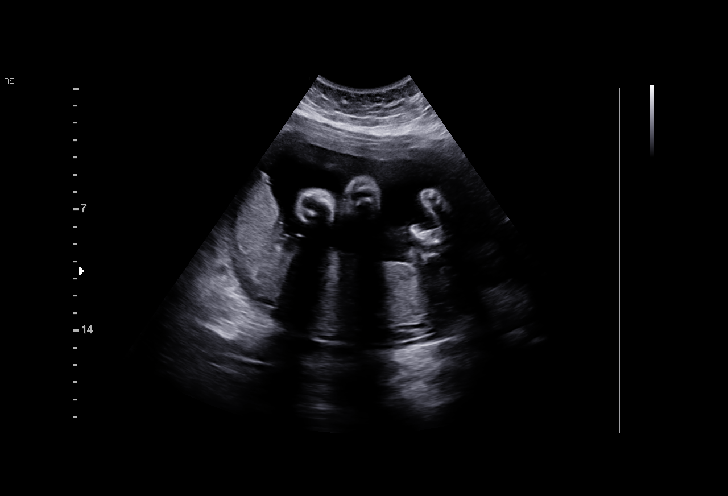
[im 12/46]
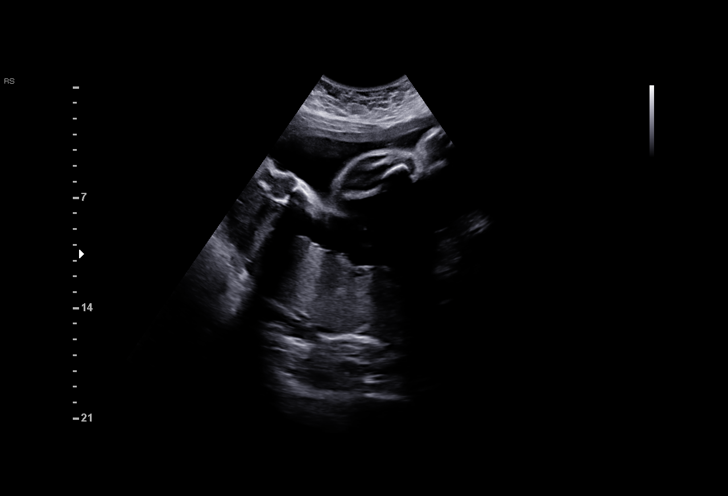
[im 16/46]
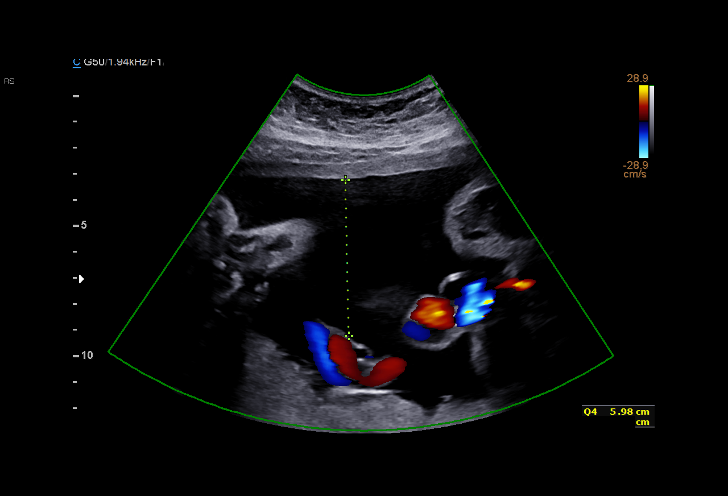
[im 19/46]
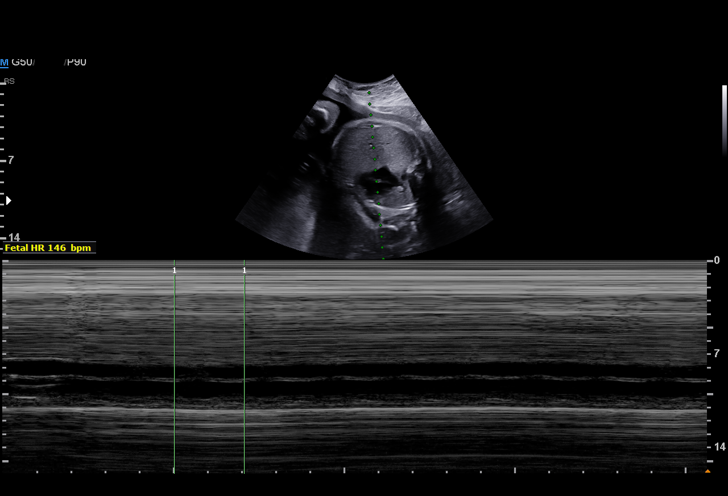
[im 24/46]
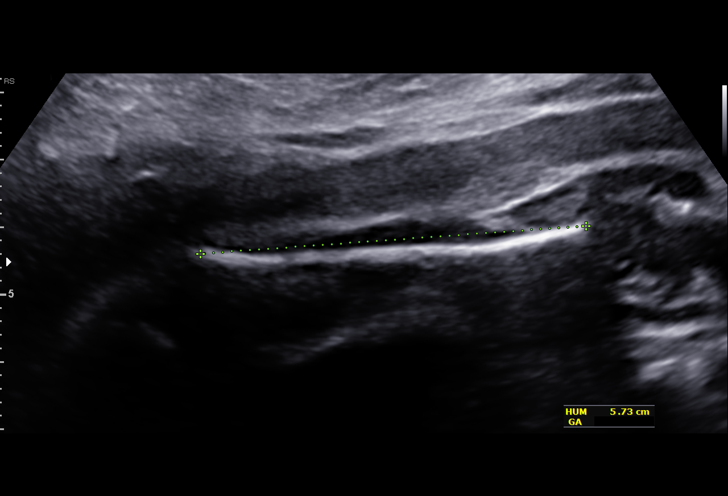
[im 27/46]
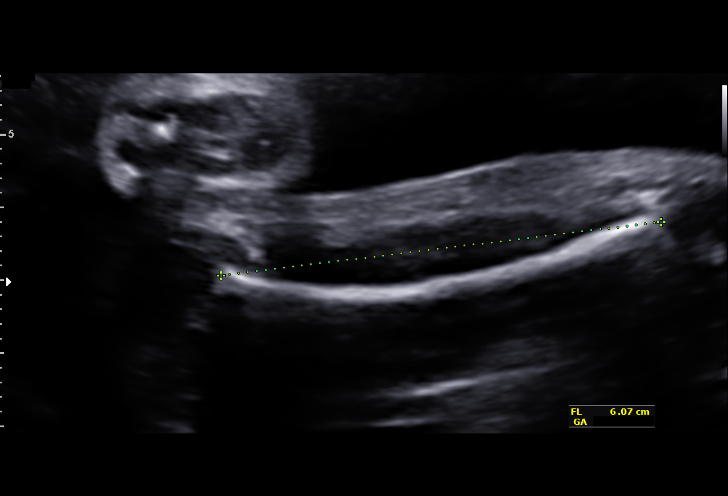
[im 31/46]
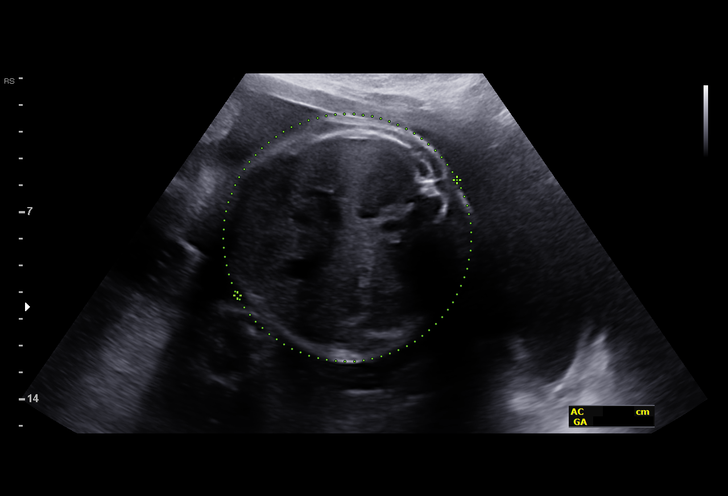
[im 34/46]
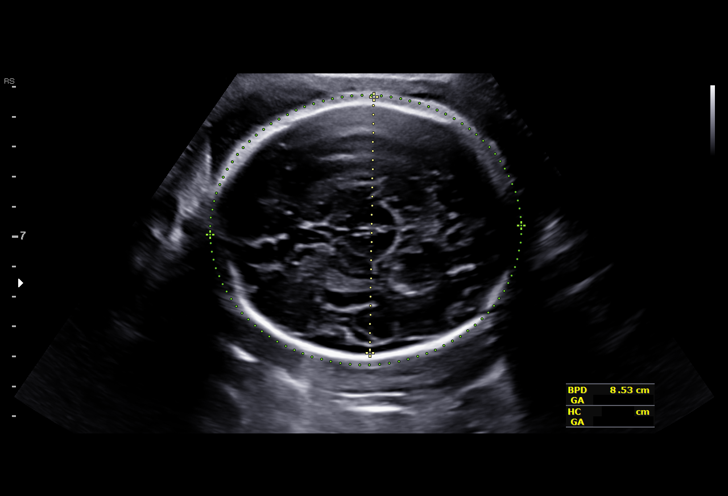
[im 37/46]
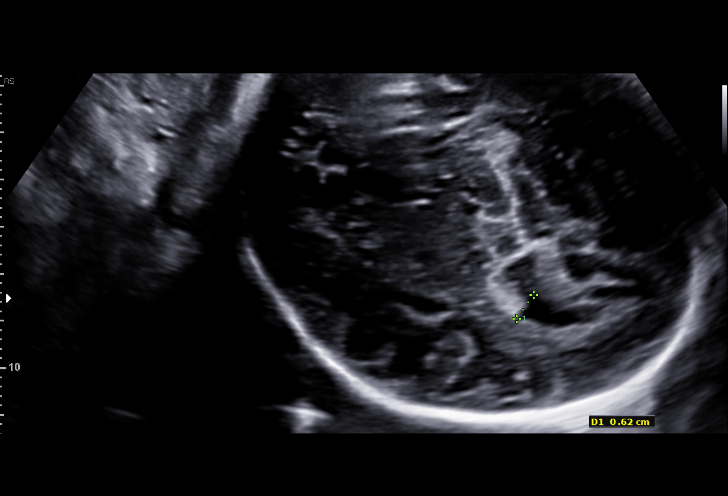
[im 41/46]
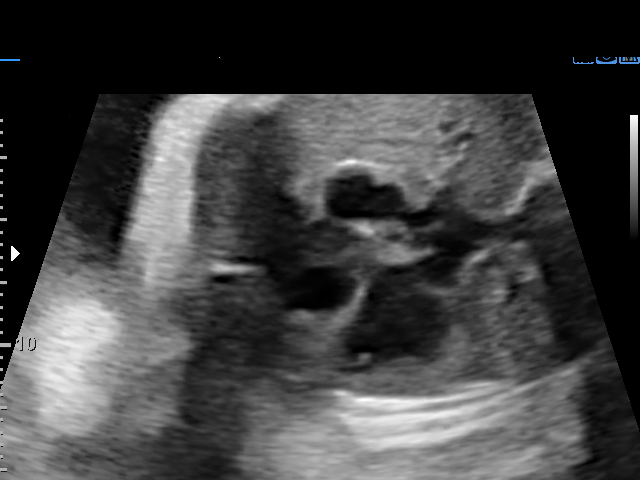
[im 44/46]
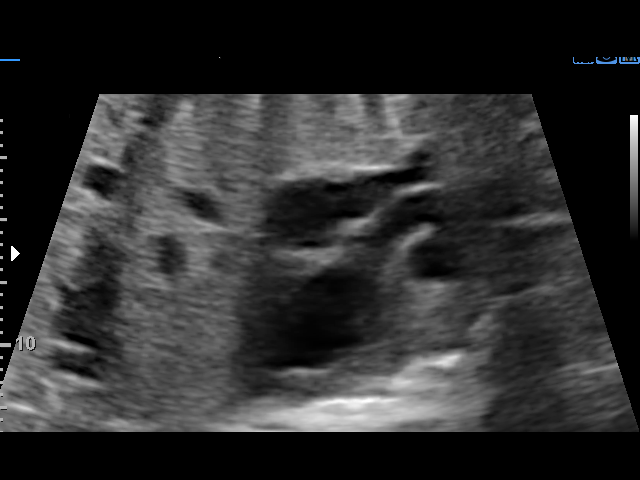

[13 of 28 positions shown; findings below may reference images not displayed]

Road [HOSPITAL]

Indications

30 weeks gestation of pregnancy
Uterine fibroids
Encounter for other antenatal screening
follow-up
Obesity complicating pregnancy, third
trimester
OB History

Blood Type:            Height:  5'8"   Weight (lb):  236       BMI:
Gravidity:    1         Term:   0        Prem:   0        SAB:   0
TOP:          0       Ectopic:  0        Living: 0
Fetal Evaluation

Num Of Fetuses:     1
Fetal Heart         146
Rate(bpm):
Cardiac Activity:   Observed
Presentation:       Cephalic
Placenta:           Posterior, above cervical os
P. Cord Insertion:  Previously Visualized

Amniotic Fluid
AFI FV:      Subjectively within normal limits

AFI Sum(cm)     %Tile       Largest Pocket(cm)
21.03           82

RUQ(cm)       RLQ(cm)       LUQ(cm)        LLQ(cm)
6.13
Biometry

BPD:      84.3  mm     G. Age:  33w 6d         99  %    CI:        76.13   %    70 - 86
FL/HC:      19.9   %    19.3 -
HC:      306.2  mm     G. Age:  34w 1d         92  %    HC/AC:      1.05        0.96 -
AC:       292   mm     G. Age:  33w 1d         95  %    FL/BPD:     72.2   %    71 - 87
FL:       60.9  mm     G. Age:  31w 5d         58  %    FL/AC:      20.9   %    20 - 24
HUM:      56.8  mm     G. Age:  33w 0d         94  %

Est. FW:    6160  gm      4 lb 9 oz     83  %
Gestational Age

LMP:           30w 6d        Date:  06/02/16                 EDD:   03/09/17
U/S Today:     33w 2d                                        EDD:   02/20/17
Best:          30w 6d     Det. By:  LMP  (06/02/16)          EDD:   03/09/17
Anatomy

Cranium:               Appears normal         Aortic Arch:            Previously seen
Cavum:                 Previously seen        Ductal Arch:            Previously seen
Ventricles:            Appears normal         Diaphragm:              Previously seen
Choroid Plexus:        Previously seen        Stomach:                Appears normal, left
sided
Cerebellum:            Previously seen        Abdomen:                Appears normal
Posterior Fossa:       Previously seen        Abdominal Wall:         Previously seen
Nuchal Fold:           Not applicable (>20    Cord Vessels:           Previously seen
wks GA)
Face:                  Orbits and profile     Kidneys:                Appear normal
previously seen
Lips:                  Previously seen        Bladder:                Appears normal
Thoracic:              Appears normal         Spine:                  Previously seen
Heart:                 Appears normal         Upper Extremities:      Previously seen
(4CH, axis, and situs
RVOT:                  Previously seen        Lower Extremities:      Previously seen
LVOT:                  Appears normal

Other:  Fetus appears to be a male. Heels pre visualized. Right 5th digit
prev.visualized. Nasal bone prev.visualized. Technically difficult due
to fetal position.
Cervix Uterus Adnexa

Cervix
Not visualized (advanced GA >47wks)

Uterus
Single fibroid noted, see table below.

Left Ovary
Not visualized.

Right Ovary
Not visualized.

Adnexa:       No abnormality visualized. No adnexal mass
visualized.
Myomas

Site                     L(cm)      W(cm)      D(cm)      Location
Fundus                   9.7        9.8        6.5        Pedunculated

Blood Flow                 RI        PI       Comments

Impression

SIUP at 30+6 weeks
Normal interval anatomy; anatomic survey complete
Normal amniotic fluid volume
Appropriate interval growth with EFW at the 83rd %tile
Fibroid uterus: see above for size and location
Recommendations

Follow-up as clinically indicated

## 2019-12-03 ENCOUNTER — Telehealth (HOSPITAL_COMMUNITY): Payer: Self-pay

## 2019-12-03 NOTE — Telephone Encounter (Signed)
Hotline referral answered. Pt interested in monoclonal infusion therapy, qualifies, and awaits call from APP for scheduling.

## 2019-12-04 ENCOUNTER — Telehealth: Payer: Self-pay | Admitting: Unknown Physician Specialty

## 2019-12-04 ENCOUNTER — Ambulatory Visit (HOSPITAL_COMMUNITY)
Admission: RE | Admit: 2019-12-04 | Discharge: 2019-12-04 | Disposition: A | Discharge status: Home / Self Care | Payer: BC Managed Care – PPO | Source: Ambulatory Visit | Attending: Pulmonary Disease | Admitting: Pulmonary Disease

## 2019-12-04 ENCOUNTER — Other Ambulatory Visit: Payer: Self-pay | Admitting: Unknown Physician Specialty

## 2019-12-04 DIAGNOSIS — U071 COVID-19: Secondary | ICD-10-CM

## 2019-12-04 DIAGNOSIS — E669 Obesity, unspecified: Secondary | ICD-10-CM

## 2019-12-04 MED ORDER — DIPHENHYDRAMINE HCL 50 MG/ML IJ SOLN: 50.0000 mg | Freq: Once | INTRAMUSCULAR | Status: DC | PRN

## 2019-12-04 MED ORDER — ALBUTEROL SULFATE HFA 108 (90 BASE) MCG/ACT IN AERS: 2.0000 | INHALATION_SPRAY | Freq: Once | RESPIRATORY_TRACT | Status: DC | PRN

## 2019-12-04 MED ORDER — METHYLPREDNISOLONE SODIUM SUCC 125 MG IJ SOLR: 125.0000 mg | Freq: Once | INTRAMUSCULAR | Status: DC | PRN

## 2019-12-04 MED ORDER — SODIUM CHLORIDE 0.9 % IV SOLN: Freq: Once | INTRAVENOUS | Status: AC

## 2019-12-04 MED ORDER — SODIUM CHLORIDE 0.9 % IV SOLN: INTRAVENOUS | Status: DC | PRN

## 2019-12-04 MED ORDER — FAMOTIDINE IN NACL 20-0.9 MG/50ML-% IV SOLN: 20.0000 mg | Freq: Once | INTRAVENOUS | Status: DC | PRN

## 2019-12-04 MED ORDER — EPINEPHRINE 0.3 MG/0.3ML IJ SOAJ: 0.3000 mg | Freq: Once | INTRAMUSCULAR | Status: DC | PRN

## 2019-12-04 NOTE — Discharge Instructions (Signed)

## 2019-12-04 NOTE — Telephone Encounter (Signed)
I connected by phone with Tiffany Sullivan on 12/04/2019 at 8:39 AM to discuss the potential use of a new treatment for mild to moderate COVID-19 viral infection in non-hospitalized patients.  This patient is a 29 y.o. female that meets the FDA criteria for Emergency Use Authorization of COVID monoclonal antibody casirivimab/imdevimab or bamlanivimab/eteseviamb.  Has a (+) direct SARS-CoV-2 viral test result  Has mild or moderate COVID-19   Is NOT hospitalized due to COVID-19  Is within 10 days of symptom onset  Has at least one of the high risk factor(s) for progression to severe COVID-19 and/or hospitalization as defined in EUA.  Specific high risk criteria : BMI > 25   I have spoken and communicated the following to the patient or parent/caregiver regarding COVID monoclonal antibody treatment:  1. FDA has authorized the emergency use for the treatment of mild to moderate COVID-19 in adults and pediatric patients with positive results of direct SARS-CoV-2 viral testing who are 71 years of age and older weighing at least 40 kg, and who are at high risk for progressing to severe COVID-19 and/or hospitalization.  2. The significant known and potential risks and benefits of COVID monoclonal antibody, and the extent to which such potential risks and benefits are unknown.  3. Information on available alternative treatments and the risks and benefits of those alternatives, including clinical trials.  4. Patients treated with COVID monoclonal antibody should continue to self-isolate and use infection control measures (e.g., wear mask, isolate, social distance, avoid sharing personal items, clean and disinfect high touch surfaces, and frequent handwashing) according to CDC guidelines.   5. The patient or parent/caregiver has the option to accept or refuse COVID monoclonal antibody treatment.  After reviewing this information with the patient, the patient has agreed to receive one of the available  covid 19 monoclonal antibodies and will be provided an appropriate fact sheet prior to infusion. [name], NP 12/04/2019 8:39 AM  Sx onset Oct 5

## 2019-12-04 NOTE — Progress Notes (Addendum)
  Diagnosis: COVID-19  Physician: Asencion Noble, MD  Procedure: [facility] Med: bamlanivimab\etesevimab infusion - Provided patient with bamlanimivab\etesevimab fact sheet for patients, parents and caregivers prior to infusion.  Complications: No immediate complications noted.  Discharge: Discharged home   Tiffany Sullivan 12/04/2019

## 2020-03-18 ENCOUNTER — Ambulatory Visit: Payer: BC Managed Care – PPO | Admitting: Family Medicine

## 2020-03-18 ENCOUNTER — Encounter: Payer: Self-pay | Admitting: Family Medicine

## 2020-03-18 ENCOUNTER — Other Ambulatory Visit: Payer: Self-pay

## 2020-03-18 VITALS — BP 120/70 | HR 91 | Ht 68.25 in | Wt 224.8 lb

## 2020-03-18 DIAGNOSIS — R531 Weakness: Secondary | ICD-10-CM

## 2020-03-18 DIAGNOSIS — D649 Anemia, unspecified: Secondary | ICD-10-CM | POA: Diagnosis not present

## 2020-03-18 DIAGNOSIS — Z87898 Personal history of other specified conditions: Secondary | ICD-10-CM

## 2020-03-18 DIAGNOSIS — E669 Obesity, unspecified: Secondary | ICD-10-CM

## 2020-03-18 DIAGNOSIS — Z86018 Personal history of other benign neoplasm: Secondary | ICD-10-CM | POA: Diagnosis not present

## 2020-03-18 DIAGNOSIS — R5383 Other fatigue: Secondary | ICD-10-CM | POA: Diagnosis not present

## 2020-03-18 DIAGNOSIS — E66811 Obesity, class 1: Secondary | ICD-10-CM

## 2020-03-18 DIAGNOSIS — Z833 Family history of diabetes mellitus: Secondary | ICD-10-CM

## 2020-03-18 LAB — POCT URINALYSIS DIP (PROADVANTAGE DEVICE)
Bilirubin, UA: NEGATIVE
Glucose, UA: NEGATIVE mg/dL
Ketones, POC UA: NEGATIVE mg/dL
Leukocytes, UA: NEGATIVE
Nitrite, UA: NEGATIVE
Protein Ur, POC: NEGATIVE mg/dL
Specific Gravity, Urine: 1.025
Urobilinogen, Ur: NEGATIVE
pH, UA: 6 (ref 5.0–8.0)

## 2020-03-18 NOTE — Progress Notes (Signed)
   Subjective:    Patient ID: Tiffany Sullivan, female    DOB: 1990/03/11, 30 y.o.   MRN: [id number]  HPI Chief Complaint  Patient presents with  . new pt     New pt get established. Feeling her pulse through her eyes, getting dizziness and lightheaded the last couple months, can get worse at her period. Last espisode was a month ago. Went to eye doctor a week ago and was told nothing is wrong. Had covid 4 months ago and ever since she has had these symptoms. Can feel her pulse throughout her body. Has had boils under her arms as well, scrarring    No PCP in years.  States she had several visits to the ED and UCs.   Complains of "heavy", tired and weak all over the week before her periods.  History of anemia.  States she is taking oral iron some days. Usually only takes it before her menstrual cycles.   States she has had a fibroid in the past.   Reports having recurrent boils. She has seen a dermatologist in the past for this.   LMP: current. Regular cycles.  No birth control right now.   Single but in a long relationship. 1 child, he is 24 years old. She is working as a Company secretary.   States she had Covid 4 1/2 months ago.   Denies fever, chills, chest pain, palpitations, shortness of breath, abdominal pain, N/V/D, urinary symptoms, LE edema.   Reviewed allergies, medications, past medical, surgical, family, and social history.   Review of Systems Pertinent positives and negatives in the history of present illness.     Objective:   Physical Exam BP 120/70   Pulse 91   Ht 5' 8.25" (1.734 m)   Wt 224 lb 12.8 oz (102 kg)   LMP 03/18/2020   BMI 33.93 kg/m   Alert and in no distress.  Neck is supple without adenopathy or thyromegaly. Cardiac exam shows a regular sinus rhythm without murmurs or gallops. Lungs are clear to auscultation. Skin is warm and dry.        Assessment & Plan:  Anemia, unspecified type - Plan: CBC with Differential/Platelet, Comprehensive  metabolic panel, Vitamin N02, Iron, TIBC and Ferritin Panel She is taking oral iron only before her cycles. Discussed potential need for increasing iron supplement. No obvious signs of severe anemia on exam.   Episode of generalized weakness - Plan: POCT Urinalysis DIP (Proadvantage Device), TSH, T4, free, T3 -she will stay well hydrated and at a healthy diet. I will follow up pending lab results.   History of syncope  History of uterine fibroid -follow up with OB/GYN   Fatigue, unspecified type - Plan: POCT Urinalysis DIP (Proadvantage Device), TSH, T4, free, T3, VITAMIN D 25 Hydroxy (Vit-D Deficiency, Fractures), Vitamin B12, Hemoglobin A1c -discussed possible etiologies. Check labs and follow up  Family history of diabetes mellitus (DM) - Plan: Hemoglobin A1c  Obesity (BMI 30.0-34.9) - Plan: Hemoglobin A1c -discussed potential long term health risks of obesity including DM. Check labs and follow up.

## 2020-03-18 NOTE — Progress Notes (Signed)
She is on her menstrual cycle so the blood is not concerning.

## 2020-03-18 NOTE — Patient Instructions (Signed)
Obgyn Offices:   [facility] [address] [place], [place] [address] [phone]  [facility] Address: [address] [place], [place] [address] Phone: [phone]  [facility] [address] [place], [place] [address] Phone: [phone]   [facility] [address] [place], [place] [address] Phone: [phone] 

## 2020-03-19 ENCOUNTER — Encounter: Payer: Self-pay | Admitting: Family Medicine

## 2020-03-19 ENCOUNTER — Other Ambulatory Visit: Payer: Self-pay | Admitting: Family Medicine

## 2020-03-19 DIAGNOSIS — E559 Vitamin D deficiency, unspecified: Secondary | ICD-10-CM

## 2020-03-19 DIAGNOSIS — D509 Iron deficiency anemia, unspecified: Secondary | ICD-10-CM

## 2020-03-19 LAB — CBC WITH DIFFERENTIAL/PLATELET
Basophils Absolute: 0 10*3/uL (ref 0.0–0.2)
Basos: 1 %
EOS (ABSOLUTE): 0.1 10*3/uL (ref 0.0–0.4)
Eos: 1 %
Hematocrit: 41.1 % (ref 34.0–46.6)
Hemoglobin: 12.3 g/dL (ref 11.1–15.9)
Immature Grans (Abs): 0 10*3/uL (ref 0.0–0.1)
Immature Granulocytes: 0 %
Lymphocytes Absolute: 1.8 10*3/uL (ref 0.7–3.1)
Lymphs: 21 %
MCH: 22.8 pg — ABNORMAL LOW (ref 26.6–33.0)
MCHC: 29.9 g/dL — ABNORMAL LOW (ref 31.5–35.7)
MCV: 76 fL — ABNORMAL LOW (ref 79–97)
Monocytes Absolute: 0.4 10*3/uL (ref 0.1–0.9)
Monocytes: 4 %
Neutrophils Absolute: 6.4 10*3/uL (ref 1.4–7.0)
Neutrophils: 73 %
Platelets: 372 10*3/uL (ref 150–450)
RBC: 5.4 x10E6/uL — ABNORMAL HIGH (ref 3.77–5.28)
RDW: 16.7 % — ABNORMAL HIGH (ref 11.7–15.4)
WBC: 8.7 10*3/uL (ref 3.4–10.8)

## 2020-03-19 LAB — HEMOGLOBIN A1C
Est. average glucose Bld gHb Est-mCnc: 94 mg/dL
Hgb A1c MFr Bld: 4.9 % (ref 4.8–5.6)

## 2020-03-19 LAB — IRON,TIBC AND FERRITIN PANEL
Ferritin: 26 ng/mL (ref 15–150)
Iron Saturation: 7 % — CL (ref 15–55)
Iron: 24 ug/dL — ABNORMAL LOW (ref 27–159)
Total Iron Binding Capacity: 368 ug/dL (ref 250–450)
UIBC: 344 ug/dL (ref 131–425)

## 2020-03-19 LAB — COMPREHENSIVE METABOLIC PANEL
ALT: 9 IU/L (ref 0–32)
AST: 9 IU/L (ref 0–40)
Albumin/Globulin Ratio: 1.4 (ref 1.2–2.2)
Albumin: 4.5 g/dL (ref 3.9–5.0)
Alkaline Phosphatase: 64 IU/L (ref 44–121)
BUN/Creatinine Ratio: 10 (ref 9–23)
BUN: 9 mg/dL (ref 6–20)
Bilirubin Total: 0.6 mg/dL (ref 0.0–1.2)
CO2: 22 mmol/L (ref 20–29)
Calcium: 9.9 mg/dL (ref 8.7–10.2)
Chloride: 100 mmol/L (ref 96–106)
Creatinine, Ser: 0.89 mg/dL (ref 0.57–1.00)
GFR calc Af Amer: 101 mL/min/{1.73_m2} (ref >59)
GFR calc non Af Amer: 88 mL/min/{1.73_m2} (ref >59)
Globulin, Total: 3.2 g/dL (ref 1.5–4.5)
Glucose: 79 mg/dL (ref 65–99)
Potassium: 4 mmol/L (ref 3.5–5.2)
Sodium: 138 mmol/L (ref 134–144)
Total Protein: 7.7 g/dL (ref 6.0–8.5)

## 2020-03-19 LAB — VITAMIN B12: Vitamin B-12: 496 pg/mL (ref 232–1245)

## 2020-03-19 LAB — TSH: TSH: 0.983 u[IU]/mL (ref 0.450–4.500)

## 2020-03-19 LAB — VITAMIN D 25 HYDROXY (VIT D DEFICIENCY, FRACTURES): Vit D, 25-Hydroxy: 17 ng/mL — ABNORMAL LOW (ref 30.0–100.0)

## 2020-03-19 LAB — T3: T3, Total: 173 ng/dL (ref 71–180)

## 2020-03-19 LAB — T4, FREE: Free T4: 1.53 ng/dL (ref 0.82–1.77)

## 2020-03-19 MED ORDER — VITAMIN D (ERGOCALCIFEROL) 1.25 MG (50000 UNIT) PO CAPS: 50000.0000 [IU] | ORAL_CAPSULE | ORAL | 0 refills | Status: DC

## 2020-03-24 NOTE — Progress Notes (Signed)
It looks as though she has not read her mychart note and lab results.

## 2020-09-10 NOTE — Progress Notes (Signed)
Chief Complaint  Patient presents with   Flank Pain    Right sided pain that goes around to her back (mid back). Symptoms started about 4-5 days ago. Tylenol helps some. Also feels like she has been having a lot of acid reflux lately.    Pain at R flank/lateral abdominal wall/RUQ, started Saturday. She was laying in the bed when she first noted the discomfort. She also has had some back pain across the mid-back. Saturday she felt LH. She was worried about a UTI, with similar pain.  She denies any urinary symptoms.  She has also had some cramping in the upper stomach. She has been having "really really bad" heartburn in the last 2 weeks. She has been using Tums, which helps some, not completely.  Has noted some burning up into her chest.  Denies change in diet. Alcohol on weekends, no change.  When she saw that she had a low-grade fever when taken by the nurse, this caused her to get very anxious--worried that something is very wrong with her, worried that she could have COVID (apparently had in the past, was very sick, and she thinks she may be having some PTSD and fear related to possibly having COVID again, due to having a lowgrade fever today). "Something is going on right now, I don't feel like I'm myself. I don't feel normal"  Her anxiety got progressively worse during the visit with physician today.   Last saw Vickie in 02/2020-- Vitamin D deficiency, with level of 17.  She was prescribed 12 weeks of rx vitamin D. She was advised to schedule f/u lab upon completion, but nothing was arranged.  She reports she never took the vitamin D, "I'm not a big fan of medicine". She recently started back taking MVI, just 3 weeks ago Iron stores also low, recommended iron. Never took iron.  PMH, PSH, SH reviewed  Outpatient Encounter Medications as of 09/11/2020  Medication Sig   loratadine (CLARITIN) 10 MG tablet Take 10 mg by mouth daily.   Multiple Vitamins-Minerals (MULTIVITAMIN WITH  MINERALS) tablet Take 1 tablet by mouth daily.   [DISCONTINUED] Vitamin D, Ergocalciferol, (DRISDOL) 1.25 MG (50000 UNIT) CAPS capsule Take 1 capsule (50,000 Units total) by mouth every 7 (seven) days.   No facility-administered encounter medications on file as of 09/11/2020.   No Known Allergies   ROS:  no known fever, chills, no vomiting, diarrhea, constipation. Some lactose intolerance, had ice cream the other day, but bowels normal since. No urinary symptoms. No vaginal discharge Some allergy symptoms, claritin helps. Sexually active, withdrawal method.  Cycles are regular.   PHYSICAL EXAM:  BP 128/70   Pulse 96   Temp 99 F (37.2 C) (Tympanic)   Ht 5' 8.25" (1.734 m)   Wt 249 lb 3.2 oz (113 kg)   LMP 08/29/2020 (Exact Date)   BMI 37.61 kg/m   Wt Readings from Last 3 Encounters:  09/11/20 249 lb 3.2 oz (113 kg)  03/18/20 224 lb 12.8 oz (102 kg)  08/10/19 225 lb (102.1 kg)   Patient appears very anxious, panicky. Repeating frequently that she knows something is wrong, feels heavy, something inside is wrong. HEENT: conjunctiva and sclera are clear, EOMI, wearing mask Neck: no lymphadenopathy, thyromegaly or mass Heart: regular rate and rhythm Lungs: clear bilaterally Back: no spinal or CVA tenderness Abdomen: soft, nontender, no mass.  No hepatosplenomegaly, negative Murphy sign. Extremities: no edema Neuro: alert and oriented, normal strength.  Urine: SG 1.020, trace blood, o/w normal  Rapid COVID test negative   ASSESSMENT/PLAN:  Gastroesophageal reflux disease, unspecified whether esophagitis present - recommended 2 week trial of Prilosec OTC, and reviewed proper diet.  Can double up if 20mg  only partially effective. f/u 1-2 wks if not better  Flank pain - reassured pt that urine was normal, nothing to suggest infection or stone.  Concentrated urine--encouraged to drink more water - Plan: POCT Urinalysis DIP (Proadvantage Device), POC COVID-19, Novel Coronavirus,  NAA ([facility])  Fever, unspecified fever cause - low grade, on a hot day.  Repeat after in [facility] was the same, 99. Reassured re: negative COVID test, PCR sent. f/u if any persistent/worsening sx - Plan: POC COVID-19, Novel Coronavirus, NAA ([facility])  RUQ pain - gallbladder disease is also in the differential. Her pain doesn't seem related to eating. Consider [place] if ongoing pain  BMI 37.0-37.9, adult - wt loss encouraged, will help with GERD  Vitamin D deficiency - noncompliant with taking rx, and only recently started MVI.  She can still take Rx, vs just stay on MVI longterm, consider recheck 6 mos.  Iron deficiency anemia, unspecified iron deficiency anemia type - reviewed prior labs, low iron stores, advised of Vickie's prior recommendations.  Anxiety - pt very anxious during visit, perseverating some, admitted poss PTSD related to prior illness with COVID. Tried to reassure pt   GERD, cannot r/o gall bladder issues as cause for most of her symptoms today Then panic set in related to her lowgrade fever, which caused other symptoms to develop and come to the forefront. Tried to reassure patient that I believe we have the root of what has been causing her symptoms, and to f/u in 1-2 weeks if not improving.  We briefly discussed that withdrawal method isn't the best contraception, and if not interested in possible pregnancy to use condoms or consider other options.  I spent 35 minutes dedicated to the care of this patient, including pre-visit review of records, face to face time, post-visit ordering of testing and documentation.

## 2020-09-11 ENCOUNTER — Encounter: Payer: Self-pay | Admitting: Family Medicine

## 2020-09-11 ENCOUNTER — Ambulatory Visit: Payer: BC Managed Care – PPO | Admitting: Family Medicine

## 2020-09-11 ENCOUNTER — Other Ambulatory Visit: Payer: Self-pay

## 2020-09-11 VITALS — BP 128/70 | HR 96 | Temp 99.0°F | Ht 68.25 in | Wt 249.2 lb

## 2020-09-11 DIAGNOSIS — F419 Anxiety disorder, unspecified: Secondary | ICD-10-CM

## 2020-09-11 DIAGNOSIS — E559 Vitamin D deficiency, unspecified: Secondary | ICD-10-CM

## 2020-09-11 DIAGNOSIS — R509 Fever, unspecified: Secondary | ICD-10-CM

## 2020-09-11 DIAGNOSIS — Z6837 Body mass index (BMI) 37.0-37.9, adult: Secondary | ICD-10-CM

## 2020-09-11 DIAGNOSIS — R1011 Right upper quadrant pain: Secondary | ICD-10-CM

## 2020-09-11 DIAGNOSIS — R109 Unspecified abdominal pain: Secondary | ICD-10-CM

## 2020-09-11 DIAGNOSIS — K219 Gastro-esophageal reflux disease without esophagitis: Secondary | ICD-10-CM | POA: Diagnosis not present

## 2020-09-11 DIAGNOSIS — D509 Iron deficiency anemia, unspecified: Secondary | ICD-10-CM

## 2020-09-11 LAB — POCT URINALYSIS DIP (PROADVANTAGE DEVICE)
Bilirubin, UA: NEGATIVE
Glucose, UA: NEGATIVE mg/dL
Ketones, POC UA: NEGATIVE mg/dL
Leukocytes, UA: NEGATIVE
Nitrite, UA: NEGATIVE
Specific Gravity, Urine: 1.02
Urobilinogen, Ur: NEGATIVE
pH, UA: 6 (ref 5.0–8.0)

## 2020-09-11 LAB — POC COVID19 BINAXNOW: SARS Coronavirus 2 Ag: NEGATIVE

## 2020-09-11 NOTE — Patient Instructions (Addendum)
Please take Prilosec OTC once daily for the next 2 weeks.  Take it before a meal. Try and limit spicy foods, caffeine, alcohol, tomatoes, citrus, peppermint. See handout. Try and eat small frequent meals rather than large meals. Wait at least 2-3 hours after eating before laying down  See info below.  Pay attention to the pain on the right side  If this is happening after eating, it could be your gall bladder. Limit the fatty foods in your diet--stay away from fried foods, greasy foods, fast food. If you have ongoing problems with the right-sided pain, let us know.  Please re-consider taking the vitamin D prescription.  Definitely continue your multivitamin daily.  Food Choices for Gastroesophageal Reflux Disease, Adult When you have gastroesophageal reflux disease (GERD), the foods you eat and your eating habits are very important. Choosing the right foods can help ease the discomfort of GERD. Consider working with a dietitian to help you [employer] choices. What are tips for following this plan? Reading food labels Look for foods that are low in saturated fat. Foods that have less than 5% of daily value (DV) of fat and 0 g of trans fats may help with your symptoms. Cooking Cook foods using methods other than frying. This may include baking, steaming, grilling, or broiling. These are all methods that do not need a lot of fat for cooking. To add flavor, try to use herbs that are low in spice and acidity. Meal planning  Choose healthy foods that are low in fat, such as fruits, vegetables, whole grains, low-fat dairy products, lean meats, fish, and poultry. Eat frequent, small meals instead of three large meals each day. Eat your meals slowly, in a relaxed setting. Avoid bending over or lying down until 2-3 hours after eating. Limit high-fat foods such as fatty meats or fried foods. Limit your intake of fatty foods, such as oils, butter, and shortening. Avoid the following as told by  your health care provider: Foods that cause symptoms. These may be different for different people. Keep a food diary to keep track of foods that cause symptoms. Alcohol. Drinking large amounts of liquid with meals. Eating meals during the 2-3 hours before bed.  Lifestyle Maintain a healthy weight. Ask your health care provider what weight is healthy for you. If you need to lose weight, work with your health care provider to do so safely. Exercise for at least 30 minutes on 5 or more days each week, or as told by your health care provider. Avoid wearing clothes that fit tightly around your waist and chest. Do not use any products that contain nicotine or tobacco. These products include cigarettes, chewing tobacco, and vaping devices, such as e-cigarettes. If you need help quitting, ask your health care provider. Sleep with the head of your bed raised. Use a wedge under the mattress or blocks under the bed frame to raise the head of the bed. Chew sugar-free gum after mealtimes. What foods should I eat?  Eat a healthy, well-balanced diet of fruits, vegetables, whole grains, low-fat dairy products, lean meats, fish, and poultry. Each person is different. Foods that may trigger symptoms in one person may not trigger any symptoms in another person. Work with your health care provider to identify foods that are safe foryou. The items listed above may not be a complete list of recommended foods and beverages. Contact a dietitian for more information. What foods should I avoid? Limiting some of these foods may help manage the symptoms  of GERD. Everyone is different. Consult a dietitian or your health care provider to help youidentify the exact foods to avoid, if any. Fruits Any fruits prepared with added fat. Any fruits that cause symptoms. For some people this may include citrus fruits, such as oranges, grapefruit, pineapple,and lemons. Vegetables Deep-fried vegetables. [place] fries. Any vegetables  prepared with added fat. Any vegetables that cause symptoms. For some people, this may include tomatoesand tomato products, chili peppers, onions and garlic, and horseradish. Grains Pastries or quick breads with added fat. Meats and other proteins High-fat meats, such as fatty beef or pork, hot dogs, ribs, ham, sausage, salami, and bacon. Fried meat or protein, including fried fish and friedchicken. Nuts and nut butters, in large amounts. Dairy Whole milk and chocolate milk. Sour cream. Cream. Ice cream. Cream cheese.Milkshakes. Fats and oils Butter. Margarine. Shortening. Ghee. Beverages Coffee and tea, with or without caffeine. Carbonated beverages. Sodas. Energy drinks. Fruit juice made with acidic fruits, such as orange or grapefruit.Tomato juice. Alcoholic drinks. Sweets and desserts Chocolate and cocoa. Donuts. Seasonings and condiments Pepper. Peppermint and spearmint. Added salt. Any condiments, herbs, or seasonings that cause symptoms. For some people, this may include curry, hotsauce, or vinegar-based salad dressings. The items listed above may not be a complete list of foods and beverages to avoid. Contact a dietitian for more information. Questions to ask your health care provider Diet and lifestyle changes are usually the first steps that are taken to manage symptoms of GERD. If diet and lifestyle changes do not improve your symptoms,talk with your health care provider about taking medicines. Where to find more information International Foundation for Gastrointestinal Disorders: aboutgerd.org Summary When you have gastroesophageal reflux disease (GERD), food and lifestyle choices may be very helpful in easing the discomfort of GERD. Eat frequent, small meals instead of three large meals each day. Eat your meals slowly, in a relaxed setting. Avoid bending over or lying down until 2-3 hours after eating. Limit high-fat foods such as fatty meats or fried foods. This information is  not intended to replace advice given to you by your health care provider. Make sure you discuss any questions you have with your healthcare provider. Document Revised: 08/21/2019 Document Reviewed: 08/21/2019 Elsevier Patient Education  [place].

## 2020-09-12 LAB — NOVEL CORONAVIRUS, NAA: SARS-CoV-2, NAA: NOT DETECTED

## 2020-09-12 LAB — SARS-COV-2, NAA 2 DAY TAT

## 2020-10-09 ENCOUNTER — Ambulatory Visit: Payer: BC Managed Care – PPO | Admitting: Family Medicine

## 2020-10-09 ENCOUNTER — Encounter: Payer: Self-pay | Admitting: Family Medicine

## 2020-10-09 ENCOUNTER — Other Ambulatory Visit: Payer: Self-pay

## 2020-10-09 VITALS — BP 124/78 | HR 97 | Temp 98.5°F | Wt 249.4 lb

## 2020-10-09 DIAGNOSIS — R8290 Unspecified abnormal findings in urine: Secondary | ICD-10-CM | POA: Diagnosis not present

## 2020-10-09 DIAGNOSIS — E559 Vitamin D deficiency, unspecified: Secondary | ICD-10-CM

## 2020-10-09 DIAGNOSIS — L02411 Cutaneous abscess of right axilla: Secondary | ICD-10-CM

## 2020-10-09 DIAGNOSIS — D509 Iron deficiency anemia, unspecified: Secondary | ICD-10-CM

## 2020-10-09 DIAGNOSIS — R5383 Other fatigue: Secondary | ICD-10-CM | POA: Diagnosis not present

## 2020-10-09 LAB — POCT URINALYSIS DIP (CLINITEK)
Bilirubin, UA: NEGATIVE
Glucose, UA: NEGATIVE mg/dL
Ketones, POC UA: NEGATIVE mg/dL
Nitrite, UA: NEGATIVE
POC PROTEIN,UA: NEGATIVE
Spec Grav, UA: 1.025 (ref 1.010–1.025)
Urobilinogen, UA: 0.2 E.U./dL
pH, UA: 6 (ref 5.0–8.0)

## 2020-10-09 MED ORDER — DOXYCYCLINE HYCLATE 100 MG PO TABS: 100.0000 mg | ORAL_TABLET | Freq: Two times a day (BID) | ORAL | 0 refills | Status: DC

## 2020-10-09 NOTE — Patient Instructions (Signed)
Please try and drink plenty of water.  The goal is for your urine to be very pale/clear.  Take the antibiotics twice daily for the abscess in your underarm. If you're going to be out in the sun, be sure to wear sunscreen.  Your blood test results will be available tomorrow through [facility]. I suspect that your vitamin D will again be very low.  If so, I will send in a prescription to your pharmacy for a once-weekly medication.  I hope you feel better soon!

## 2020-10-09 NOTE — Progress Notes (Signed)
Chief Complaint  Patient presents with   other    Body feels heavy started Friday went to the beach this weekend and went in a hot tub and felt very heavy after that. No pain pt. Also has boil under rt. Arm pit if you have time could you look at that.    Feels "heavy" in her body.  She has had 2 episodes.  Once occurred after getting out of hot tub while on vacation last week at the beach, felt very heavy, had to lay down.  Improved a little after laying down. Tried to drink more water. She didn't have problems over the weekend, tried to drink a lot of water. Today she went to work.  Felt the heaviness while she was at work, started this morning. Lasted 3 hours. She ate breakfast after feeling this way, but it didn't help.  Tried to drink more water, feeling better now, unsure what helped. She thinks this is occurring too frequently, wanted to get it checked out.  She drinks occasional alcohol on the weekends, had some on vacation last week, not much this past weekend.  She gets boils regularly (she reports being told she has HS in the past by derm).  She has had a boil in the R axilla for a while.  Thinks allergic to her deoderant. She had another one that drained and resolved recently.  Another one that has scarred. But she has one that is active, painful. These are all under the R arm/axilla.  Seen in July with reflux. (And had panic attack in the office due to LG fever, PTSD from [place]). Symptoms improved with 2 weeks of Prilosec. Having heartburn still, but much less frequent. She is trying to figure out which foods are contributing.   She has noticed vaginal itch x 3d. This is external, no vaginal discharge.  She has some irritation of external skin. No dysuria (just some burning externally when the urine touches the skin).  Denies urgency/frequency, flank pain, abdominal pain or fever. With the sample she provided today, admits that she had to go very badly, didn't use the wipes, was NOT  a clean catch specimen.   PMH, PSH, SH reviewed.   She has h/o iron deficiency, Vitamin D deficiency.  She states she didn't feel like this (heaviness complaints) with anemia. Last labs-- 02/2020 Iron low at 24, iron sat 7, heg 12.3 Vitamin D 17 Never filled the prescription   Outpatient Encounter Medications as of 10/09/2020  Medication Sig   loratadine (CLARITIN) 10 MG tablet Take 10 mg by mouth daily.   Multiple Vitamins-Minerals (MULTIVITAMIN WITH MINERALS) tablet Take 1 tablet by mouth daily.   No facility-administered encounter medications on file as of 10/09/2020.   No Known Allergies  ROS: no fever, chills, dizziness, n/v/d.  +vaginal itch, no urinary complaints.  Abscess/boil R axilla. "Heavy" sensation episodes x 2 per HPI. See HPI.  No chest pain, URI symptoms or other complaints.   PHYSICAL EXAM:  BP 124/78   Pulse 97   Temp 98.5 F (36.9 C)   Wt 249 lb 6.4 oz (113.1 kg)   LMP 09/28/2020   BMI 37.64 kg/m   Pleasant female, in no distress. Somewhat anxious. HEENT: conjunctiva and sclera are clear, EOMI, wearing mask Neck: no lymphadenopathy or mass Heart: regular rate and rhythm Lungs: clear bilaterally Back: no spinal or CVA tenderness R axilla--abscess noted at the lateral edge of axilla--drained spontaneously by touching it (cultured). Inferior portion of axilla there  is a more chronic one, scarred Upper axilla--soft, recently resolved. Abdomen: soft, nontender, no mass Extremities: no edema Psych: anxious appearing, normal hygiene, grooming, eye contact and speech Neuro: alert and oriented, normal gait  Urine: SG 1.025, small blood (noted previously), large leuks--pt reports not clean catch.  ASSESSMENT/PLAN:  Fatigue, unspecified type - suspect related to dehydration, poss low BP after hot tub. Encouraged hydration. Will check labs given h/o anemia, low D - Plan: POCT URINALYSIS DIP (CLINITEK), CBC with Differential/Platelet, Comprehensive metabolic  panel, VITAMIN D 25 Hydroxy (Vit-D Deficiency, Fractures)  Iron deficiency anemia, unspecified iron deficiency anemia type - Plan: CBC with Differential/Platelet, Ferritin  Vitamin D deficiency - never took rx as prescribed in Jan. Recheck today and expect to need Rx - Plan: VITAMIN D 25 Hydroxy (Vit-D Deficiency, Fractures)  Abnormal urinalysis - wasn't clean catch, may be contaminant. Will send for culture - Plan: Urine Culture  Abscess of axilla, right - HS.  Warm compresses to continue drainage.  Doxy. SE reviewed - Plan: WOUND CULTURE, doxycycline (VIBRA-TABS) 100 MG tablet  GERD--improved compared to last visit.   C-met, cbc, D Will likely need Rx for D, to be sent after reviewing labs.

## 2020-10-10 LAB — CBC WITH DIFFERENTIAL/PLATELET
Basophils Absolute: 0.1 10*3/uL (ref 0.0–0.2)
Basos: 1 %
EOS (ABSOLUTE): 0.2 10*3/uL (ref 0.0–0.4)
Eos: 2 %
Hematocrit: 36.7 % (ref 34.0–46.6)
Hemoglobin: 11.5 g/dL (ref 11.1–15.9)
Immature Grans (Abs): 0 10*3/uL (ref 0.0–0.1)
Immature Granulocytes: 0 %
Lymphocytes Absolute: 2.4 10*3/uL (ref 0.7–3.1)
Lymphs: 22 %
MCH: 23.5 pg — ABNORMAL LOW (ref 26.6–33.0)
MCHC: 31.3 g/dL — ABNORMAL LOW (ref 31.5–35.7)
MCV: 75 fL — ABNORMAL LOW (ref 79–97)
Monocytes Absolute: 0.4 10*3/uL (ref 0.1–0.9)
Monocytes: 4 %
Neutrophils Absolute: 7.9 10*3/uL — ABNORMAL HIGH (ref 1.4–7.0)
Neutrophils: 71 %
Platelets: 340 10*3/uL (ref 150–450)
RBC: 4.89 x10E6/uL (ref 3.77–5.28)
RDW: 15.7 % — ABNORMAL HIGH (ref 11.7–15.4)
WBC: 11 10*3/uL — ABNORMAL HIGH (ref 3.4–10.8)

## 2020-10-10 LAB — COMPREHENSIVE METABOLIC PANEL
ALT: 21 IU/L (ref 0–32)
AST: 18 IU/L (ref 0–40)
Albumin/Globulin Ratio: 1.3 (ref 1.2–2.2)
Albumin: 4.5 g/dL (ref 3.9–5.0)
Alkaline Phosphatase: 59 IU/L (ref 44–121)
BUN/Creatinine Ratio: 10 (ref 9–23)
BUN: 8 mg/dL (ref 6–20)
Bilirubin Total: 0.5 mg/dL (ref 0.0–1.2)
CO2: 20 mmol/L (ref 20–29)
Calcium: 10.4 mg/dL — ABNORMAL HIGH (ref 8.7–10.2)
Chloride: 100 mmol/L (ref 96–106)
Creatinine, Ser: 0.79 mg/dL (ref 0.57–1.00)
Globulin, Total: 3.6 g/dL (ref 1.5–4.5)
Glucose: 70 mg/dL (ref 65–99)
Potassium: 3.8 mmol/L (ref 3.5–5.2)
Sodium: 137 mmol/L (ref 134–144)
Total Protein: 8.1 g/dL (ref 6.0–8.5)
eGFR: 103 mL/min/{1.73_m2} (ref >59)

## 2020-10-10 LAB — FERRITIN: Ferritin: 27 ng/mL (ref 15–150)

## 2020-10-10 LAB — VITAMIN D 25 HYDROXY (VIT D DEFICIENCY, FRACTURES): Vit D, 25-Hydroxy: 25.2 ng/mL — ABNORMAL LOW (ref 30.0–100.0)

## 2020-10-10 MED ORDER — VITAMIN D (ERGOCALCIFEROL) 1.25 MG (50000 UNIT) PO CAPS: 50000.0000 [IU] | ORAL_CAPSULE | ORAL | 0 refills | Status: DC

## 2020-10-10 NOTE — Addendum Note (Signed)
Addended by: Rita Ohara on: 10/10/2020 06:57 AM   Modules accepted: Orders

## 2020-10-11 ENCOUNTER — Telehealth: Payer: Self-pay | Admitting: Family Medicine

## 2020-10-11 LAB — URINE CULTURE

## 2020-10-11 NOTE — Telephone Encounter (Signed)
Sending this to both providers. Pt called and said she saw her urine results in mychart and it said she should be taking penicillin. Pt was prescribed Doxycycline and said she started taking that today.She wants to see if she needs to be taking something else

## 2020-10-11 NOTE — Telephone Encounter (Signed)
Advise pt--doxycycline is the appropriate antibiotic for the abscess in her underarm.  The culture she saw was for the urine.  It shows Group B strep, but only a small amount, and not indicative of a urinary tract infection. She doesn't need any PCN

## 2020-10-11 NOTE — Telephone Encounter (Signed)
Pt advised.

## 2020-10-17 LAB — WOUND CULTURE: Organism ID, Bacteria: NONE SEEN

## 2021-04-09 ENCOUNTER — Ambulatory Visit: Payer: Self-pay | Admitting: Obstetrics & Gynecology

## 2021-04-22 ENCOUNTER — Ambulatory Visit: Payer: Self-pay | Admitting: Obstetrics & Gynecology

## 2021-04-30 ENCOUNTER — Other Ambulatory Visit: Payer: Self-pay

## 2021-04-30 ENCOUNTER — Other Ambulatory Visit: Payer: Self-pay | Admitting: Obstetrics

## 2021-04-30 ENCOUNTER — Encounter: Payer: Self-pay | Admitting: Obstetrics

## 2021-04-30 ENCOUNTER — Ambulatory Visit (INDEPENDENT_AMBULATORY_CARE_PROVIDER_SITE_OTHER): Payer: BC Managed Care – PPO | Admitting: Obstetrics

## 2021-04-30 VITALS — BP 129/81 | HR 95 | Wt 251.0 lb

## 2021-04-30 DIAGNOSIS — L819 Disorder of pigmentation, unspecified: Secondary | ICD-10-CM

## 2021-04-30 DIAGNOSIS — N6315 Unspecified lump in the right breast, overlapping quadrants: Secondary | ICD-10-CM

## 2021-04-30 NOTE — Progress Notes (Signed)
Patient ID: Tiffany Sullivan, female   DOB: 31-May-1990, 31 y.o.   MRN: [id number] ? ?Chief Complaint  ?Patient presents with  ? Breast Problem  ? ? ?HPI ?Tiffany Sullivan is a 31 y.o. female.  Complains of spots on breasts and possible mass in right breast in axillary area. ?HPI ? ?Past Medical History:  ?Diagnosis Date  ? Allergy   ? Anemia   ? Fibroid   ? 1 small located on top of ovary  ? GERD (gastroesophageal reflux disease)   ? Syncope and collapse   ? ? ?Past Surgical History:  ?Procedure Laterality Date  ? NO PAST SURGERIES    ? ? ?Family History  ?Problem Relation Age of Onset  ? Diabetes Maternal Grandfather   ? Breast cancer Paternal Grandmother 84  ? Stroke Paternal Grandfather   ? Breast cancer Maternal Aunt 6  ? Kidney disease Maternal Aunt   ? Diabetes Maternal Aunt   ? ? ?Social History ?Social History  ? ?Tobacco Use  ? Smoking status: Never  ? Smokeless tobacco: Never  ?Vaping Use  ? Vaping Use: Never used  ?Substance Use Topics  ? Alcohol use: Yes  ?  Alcohol/week: 3.0 standard drinks  ?  Types: 3 Shots of liquor per week  ?  Comment: drinks alcohol on the weekends.   ? Drug use: Not Currently  ?  Comment: last used >3 weeks ago (marijuana)  ? ? ?No Known Allergies ? ?Current Outpatient Medications  ?Medication Sig Dispense Refill  ? loratadine (CLARITIN) 10 MG tablet Take 10 mg by mouth daily. (Patient not taking: Reported on 04/30/2021)    ? Multiple Vitamins-Minerals (MULTIVITAMIN WITH MINERALS) tablet Take 1 tablet by mouth daily. (Patient not taking: Reported on 04/30/2021)    ? Vitamin D, Ergocalciferol, (DRISDOL) 1.25 MG (50000 UNIT) CAPS capsule Take 1 capsule (50,000 Units total) by mouth every 7 (seven) days. (Patient not taking: Reported on 04/30/2021) 12 capsule 0  ? ?No current facility-administered medications for this visit.  ? ? ?Review of Systems ?Review of Systems ?Constitutional: negative for fatigue and weight loss ?Respiratory: negative for cough and wheezing ?Cardiovascular: negative for  chest pain, fatigue and palpitations ?Gastrointestinal: negative for abdominal pain and change in bowel habits ?Genitourinary:negative ?Integument/breast: positive for dark spots on breasts and possible axillary mass in right breast.  negative for nipple discharge ?Musculoskeletal:negative for myalgias ?Neurological: negative for gait problems and tremors ?Behavioral/Psych: negative for abusive relationship, depression ?Endocrine: negative for temperature intolerance    ?  ?Blood pressure 129/81, pulse 95, weight 251 lb (113.9 kg), last menstrual period 04/22/2021. ? ?Physical Exam ?Physical Exam ?General:   alert  ?Skin:   no rash or abnormalities  ?Lungs:   clear to auscultation bilaterally  ?Heart:   regular rate and rhythm, S1, S2 normal, no murmur, click, rub or gallop  ?Breasts:   Spotty pigmentation.  Questionable mass in right breast axilla  ?The remainder of the physical exam deferred due to the type of encounter. ? ? ?I have spent a total of 20 minutes of face-to-face time, excluding clinical staff time, reviewing notes and preparing to see patient, ordering tests and/or medications, and counseling the patient.  ? ? ?Data Reviewed ?Labs ? ?Assessment  ?   ?1. Irregular pigmentation of skin ?Rx: ?- Ambulatory referral to Dermatology ? ?2. Breast lump on right side at 9 o'clock position ?Rx: ?- MM DIAG BREAST TOMO BILATERAL; Future  ?  ? ?Plan ?  Follow up in 2 weeks ? ?  Orders Placed This Encounter  ?Procedures  ? MM DIAG BREAST TOMO BILATERAL  ?  TOI:ZTIW NOT WAKE ?PY:KDXI/ NO NEEDS/ NO BREAST SX'S/TOMO ?SW TORI '@OFFICE'$  NM ?  ?  Standing Status:   Future  ?  Standing Expiration Date:   05/01/2022  ?  Order Specific Question:   Reason for Exam (SYMPTOM  OR DIAGNOSIS REQUIRED)  ?  Answer:   Breast lump right breast axillary area, outewr quadrant, 9 o'clock  ?  Order Specific Question:   Is the patient pregnant?  ?  Answer:   No  ?  Order Specific Question:   Preferred imaging location?  ?  [facility]  ? Ambulatory referral to Dermatology  ?  Referral Priority:   Routine  ?  Referral Type:   Consultation  ?  Referral Reason:   Specialty Services Required  ?  Requested Specialty:   Dermatology  ?  Number of Visits Requested:   1  ? ? ? Shelly Bombard, MD ?05/01/2021 1:06 PM  ?

## 2021-04-30 NOTE — Progress Notes (Signed)
Pt states she has "spots" on breast and would like checked.  ?Pt states she does get boils in her underarm area.  ? ? ? ?

## 2021-05-07 ENCOUNTER — Ambulatory Visit: Payer: Self-pay

## 2021-05-23 ENCOUNTER — Ambulatory Visit
Admission: RE | Admit: 2021-05-23 | Discharge: 2021-05-23 | Disposition: A | Discharge status: Home / Self Care | Payer: BC Managed Care – PPO | Source: Ambulatory Visit | Attending: Obstetrics | Admitting: Obstetrics

## 2021-05-23 ENCOUNTER — Other Ambulatory Visit: Payer: Self-pay | Admitting: Obstetrics

## 2021-05-23 DIAGNOSIS — N6315 Unspecified lump in the right breast, overlapping quadrants: Secondary | ICD-10-CM

## 2021-06-04 ENCOUNTER — Ambulatory Visit (INDEPENDENT_AMBULATORY_CARE_PROVIDER_SITE_OTHER): Payer: BC Managed Care – PPO | Admitting: Obstetrics

## 2021-06-04 ENCOUNTER — Ambulatory Visit
Admission: RE | Admit: 2021-06-04 | Discharge: 2021-06-04 | Disposition: A | Discharge status: Home / Self Care | Payer: BC Managed Care – PPO | Source: Ambulatory Visit | Attending: Obstetrics | Admitting: Obstetrics

## 2021-06-04 ENCOUNTER — Encounter: Payer: Self-pay | Admitting: Obstetrics

## 2021-06-04 ENCOUNTER — Other Ambulatory Visit (HOSPITAL_COMMUNITY)
Admission: RE | Admit: 2021-06-04 | Discharge: 2021-06-04 | Disposition: A | Discharge status: Home / Self Care | Payer: BC Managed Care – PPO | Source: Ambulatory Visit

## 2021-06-04 VITALS — BP 117/77 | HR 90 | Ht 67.5 in | Wt 252.4 lb

## 2021-06-04 DIAGNOSIS — N6315 Unspecified lump in the right breast, overlapping quadrants: Secondary | ICD-10-CM

## 2021-06-04 DIAGNOSIS — Z01419 Encounter for gynecological examination (general) (routine) without abnormal findings: Secondary | ICD-10-CM | POA: Insufficient documentation

## 2021-06-04 DIAGNOSIS — L819 Disorder of pigmentation, unspecified: Secondary | ICD-10-CM | POA: Diagnosis not present

## 2021-06-04 DIAGNOSIS — E669 Obesity, unspecified: Secondary | ICD-10-CM

## 2021-06-04 DIAGNOSIS — N898 Other specified noninflammatory disorders of vagina: Secondary | ICD-10-CM | POA: Insufficient documentation

## 2021-06-04 NOTE — Progress Notes (Signed)
Pt presents for annual and pap smear. ?Reports no concerns today.  ?STD bloodwork declined  ?PHQ9=0 ?GAD7=0 ? ?

## 2021-06-04 NOTE — Progress Notes (Addendum)
? ?Subjective: ? ? ?  ?  ? Tiffany Sullivan is a 31 y.o. female here for a routine exam.  Current complaints: vaginal discharge.  Also complains of dark spots on breasts and a small mass in right breast..   ? ?Personal health questionnaire:  ?Is patient Ashkenazi Jewish, have a family history of breast and/or ovarian cancer: yes ?Is there a family history of uterine cancer diagnosed at age < 69, gastrointestinal cancer, urinary tract cancer, family member who is a Field seismologist syndrome-associated carrier: no ?Is the patient overweight and hypertensive, family history of diabetes, personal history of gestational diabetes, preeclampsia or PCOS: no ?Is patient over 67, have PCOS,  family history of premature CHD under age 59, diabetes, smoke, have hypertension or peripheral artery disease:  no ?At any time, has a partner hit, kicked or otherwise hurt or frightened you?: no ?Over the past 2 weeks, have you felt down, depressed or hopeless?: no ?Over the past 2 weeks, have you felt little interest or pleasure in doing things?:no ? ? ?Gynecologic History ?Patient's last menstrual period was 05/19/2021 (exact date). ?Contraception: abstinence ?Last Pap: 2018. Results were: normal ?Last mammogram: 05-23-2021. Results were: abnormal ? ?Obstetric History ?OB History  ?Gravida Para Term Preterm AB Living  ?'1 1 1     1  '$ ?SAB IAB Ectopic Multiple Live Births  ?      0 1  ?  ?# Outcome Date GA Lbr Len/2nd Weight Sex Delivery Anes PTL Lv  ?1 Term 03/06/17 [redacted]w[redacted]d/ 02:23 8 lb 1.5 oz (3.67 kg) M Vag-Spont EPI  LIV  ? ? ?Past Medical History:  ?Diagnosis Date  ? Allergy   ? Anemia   ? Fibroid   ? 1 small located on top of ovary  ? GERD (gastroesophageal reflux disease)   ? Syncope and collapse   ?  ?Past Surgical History:  ?Procedure Laterality Date  ? NO PAST SURGERIES    ?  ? ?Current Outpatient Medications:  ?  loratadine (CLARITIN) 10 MG tablet, Take 10 mg by mouth daily., Disp: , Rfl:  ?  Multiple Vitamins-Minerals (MULTIVITAMIN WITH  MINERALS) tablet, Take 1 tablet by mouth daily., Disp: , Rfl:  ?  Vitamin D, Ergocalciferol, (DRISDOL) 1.25 MG (50000 UNIT) CAPS capsule, Take 1 capsule (50,000 Units total) by mouth every 7 (seven) days. (Patient not taking: Reported on 04/30/2021), Disp: 12 capsule, Rfl: 0 ?No Known Allergies  ?Social History  ? ?Tobacco Use  ? Smoking status: Never  ? Smokeless tobacco: Never  ?Substance Use Topics  ? Alcohol use: Yes  ?  Alcohol/week: 3.0 standard drinks  ?  Types: 3 Shots of liquor per week  ?  Comment: drinks alcohol on the weekends.   ?  ?Family History  ?Problem Relation Age of Onset  ? Diabetes Maternal Grandfather   ? Breast cancer Paternal Grandmother 660 ? Stroke Paternal Grandfather   ? Breast cancer Maternal Aunt 543 ? Kidney disease Maternal Aunt   ? Diabetes Maternal Aunt   ?  ? ? ?Review of Systems ? ?Constitutional: negative for fatigue and weight loss ?Respiratory: negative for cough and wheezing ?Cardiovascular: negative for chest pain, fatigue and palpitations ?Gastrointestinal: negative for abdominal pain and change in bowel habits ?Musculoskeletal:negative for myalgias ?Neurological: negative for gait problems and tremors ?Behavioral/Psych: negative for abusive relationship, depression ?Endocrine: negative for temperature intolerance    ?Genitourinary:negative for abnormal menstrual periods, genital lesions, hot flashes, sexual problems and vaginal discharge ?Integument/breast: positive for breast  lump and dark spots on breasts.  Negative for breast tenderness, nipple discharge  ? ?  ?Objective:  ? ?    ?BP 117/77   Pulse 90   Ht 5' 7.5" (1.715 m)   Wt 252 lb 6.4 oz (114.5 kg)   LMP 05/19/2021 (Exact Date)   BMI 38.95 kg/m?  ?General:   Alert and no distress  ?Skin:   no rash or abnormalities  ?Lungs:   clear to auscultation bilaterally  ?Heart:   regular rate and rhythm, S1, S2 normal, no murmur, click, rub or gallop  ?Breasts:   Right breast mass at 9 o'clock, soft, non tender.  Spotty  pigmentation  ?Abdomen:  normal findings: no organomegaly, soft, non-tender and no hernia  ?Pelvis:  External genitalia: normal general appearance ?Urinary system: urethral meatus normal and bladder without fullness, nontender ?Vaginal: normal without tenderness, induration or masses ?Cervix: normal appearance ?Adnexa: normal bimanual exam ?Uterus: anteverted and non-tender, normal size  ? ?Lab Review ?Urine pregnancy test ?Labs reviewed yes ?Radiologic studies reviewed yes ?  ?US BREAST LTD UNI RIGHT INC AXILLA (Accession [id number]) (Order [id number]) ?Imaging ?Date: 05/23/2021 Department: [facility] Released By: Moises Blood Authorizing: Shelly Bombard, MD  ? ?Exam Status ? ?Status  ?Final [99]  ? ?PACS Intelerad Image Link ? ? Show images for US BREAST LTD UNI RIGHT INC AXILLA ?Addendum ? ?ADDENDUM REPORT: 06/04/2021 14:29 ?  ?ADDENDUM: ?The patient decided to cancel her biopsy today. Instead, we will ?proceed with a right breast ultrasound in 6 months. ?  ?  ?Electronically Signed ?  By: Dorise Bullion III M.D. ?  On: 06/04/2021 14:29 ?   ?Addended by York Ram, MD on 06/04/2021  4:29 PM  ? ?Study Result ? ?Narrative & Impression  ?CLINICAL DATA:  Mass felt on recent physical examination in the ?posterior 9 o'clock position of the right breast. Family history of ?breast cancer in a maternal aunt and paternal grandmother in her ?71s. ?  ?EXAM: ?DIGITAL DIAGNOSTIC BILATERAL MAMMOGRAM WITH TOMOSYNTHESIS AND CAD; ?ULTRASOUND RIGHT BREAST LIMITED ?  ?TECHNIQUE: ?Bilateral digital diagnostic mammography and breast tomosynthesis ?was performed. The images were evaluated with computer-aided ?detection.; Targeted ultrasound examination of the right breast was ?performed ?  ?COMPARISON:  Previous exam(s). ?  ?ACR Breast Density Category b: There are scattered areas of ?fibroglandular density. ?  ?FINDINGS: ?There is an oval, circumscribed mass in the posterior outer  right ?breast in approximately the 9 o'clock position. No findings ?elsewhere in either breast suspicious for malignancy. ?  ?On physical exam, no discrete mass is palpable in the far lateral ?right breast today. ?  ?Targeted ultrasound is performed, showing a 1.7 x 1.1 x 0.8 cm oval, ?horizontally oriented, circumscribed, mildly hypoechoic and mildly ?heterogeneous mass is demonstrated in the 10 o'clock position of the ?right breast, 20 cm from the nipple. This corresponds to the ?mammographic mass. No internal blood flow was seen with power ?Doppler. ?  ?Ultrasound of the right axilla demonstrated no abnormal right ?axillary lymph nodes. ?  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 ?  ?RECOMMENDATION: ?Right breast ultrasound in 6 months. The option of ultrasound-guided ?core needle biopsy was also discussed with the patient but not ?recommended at this time. She would prefer biopsy due to anxiety ?associated with the mass. Therefore, a right breast ?ultrasound-guided core needle biopsy was scheduled at 1:45 p.m. on ?06/04/2021. ?  ?I have discussed  the findings and recommendations with the patient. ?If applicable, a reminder letter will be sent to the patient ?regarding the next appointment. ?  ?BI-RADS CATEGORY  3: Probably benign. ?  ?Electronically Signed: ?By: Claudie Revering M.D. ?On: 05/23/2021 16:40 ?  ?  ? ? ?I have spent a total of 20 minutes of face-to-face time, excluding clinical staff time, reviewing notes and preparing to see patient, ordering tests and/or medications, and counseling the patient.  ? ?Assessment:  ? ? 1. Encounter for gynecological examination with Papanicolaou smear of cervix ?Rx: ?- Cytology - PAP( [place]) ? ?2. Vaginal discharge ?Rx: ?- Cervicovaginal ancillary only( [place]) ? ?3. Irregular pigmentation of skin on breasts, bilaterally   ?- referred to Dermatology  ? ?4. Breast lump on right side at 9 o'clock  position ?- digital tomogram done.  Probable fibroadenoma.  Biopsy scheduled. ? ?5. Obesity (BMI 35.0-39.9 without comorbidity) ?- weight reduction with the aid of dietary changes, exercise and behavioral modification recommende

## 2021-06-05 ENCOUNTER — Ambulatory Visit: Payer: BC Managed Care – PPO | Admitting: Obstetrics

## 2021-06-05 LAB — CERVICOVAGINAL ANCILLARY ONLY
Bacterial Vaginitis (gardnerella): NEGATIVE
Candida Glabrata: NEGATIVE
Candida Vaginitis: NEGATIVE
Chlamydia: NEGATIVE
Comment: NEGATIVE
Comment: NEGATIVE
Comment: NEGATIVE
Comment: NEGATIVE
Comment: NEGATIVE
Comment: NORMAL
Neisseria Gonorrhea: NEGATIVE
Trichomonas: NEGATIVE

## 2021-06-06 LAB — CYTOLOGY - PAP
Comment: NEGATIVE
Diagnosis: NEGATIVE
High risk HPV: NEGATIVE

## 2021-06-13 ENCOUNTER — Ambulatory Visit
Admission: RE | Admit: 2021-06-13 | Discharge: 2021-06-13 | Disposition: A | Discharge status: Home / Self Care | Payer: BC Managed Care – PPO | Source: Ambulatory Visit | Attending: Obstetrics | Admitting: Obstetrics

## 2022-01-07 ENCOUNTER — Encounter: Payer: Self-pay | Admitting: Internal Medicine

## 2022-01-26 ENCOUNTER — Ambulatory Visit: Payer: BC Managed Care – PPO | Admitting: Family Medicine

## 2022-01-26 ENCOUNTER — Encounter: Payer: Self-pay | Admitting: Nurse Practitioner

## 2022-01-26 ENCOUNTER — Encounter: Payer: Self-pay | Admitting: Family Medicine

## 2022-01-26 VITALS — BP 130/80 | HR 68 | Temp 98.9°F | Ht 67.5 in | Wt 249.0 lb

## 2022-01-26 DIAGNOSIS — R0981 Nasal congestion: Secondary | ICD-10-CM | POA: Diagnosis not present

## 2022-01-26 DIAGNOSIS — R0989 Other specified symptoms and signs involving the circulatory and respiratory systems: Secondary | ICD-10-CM

## 2022-01-26 DIAGNOSIS — J069 Acute upper respiratory infection, unspecified: Secondary | ICD-10-CM

## 2022-01-26 LAB — POC COVID19 BINAXNOW: SARS Coronavirus 2 Ag: NEGATIVE

## 2022-01-26 LAB — POCT INFLUENZA A/B
Influenza A, POC: NEGATIVE
Influenza B, POC: NEGATIVE

## 2022-01-26 NOTE — Progress Notes (Signed)
Chief Complaint  Patient presents with   Nasal Congestion    Started Thursday with a scratchy throat. Runny nose, chest congestion, no fever, chills or body aches. Feels like she did when she had covid last year. Both covid and flu were negative today.    Woke up Thursday with irritated throat.  She has been waking up the last couple of days with a sore throat also. She has runny nose, chest congestion. Nasal drainage is yellow, as is her phlegm. Not truly coughing, more expectorating phlegm from her throat. Denies coughing at night. Denies sinus pain.  No fever or chills. Denies body aches, feels a little fatigued today, for the first time.  She has been using Mucinex DM, since Saturday, twice daily. She can tell it helps, noticed when her next dose was due. She used Afrin last night, and today.  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  PMH, PSH, SH reviewed  Outpatient Encounter Medications as of 01/26/2022  Medication Sig Note   dextromethorphan-guaiFENesin (MUCINEX DM) 30-600 MG 12hr tablet Take 1 tablet by mouth 2 (two) times daily. 01/26/2022: Used this am 9:00   loratadine (CLARITIN) 10 MG tablet Take 10 mg by mouth daily.    Multiple Vitamins-Minerals (MULTIVITAMIN WITH MINERALS) tablet Take 1 tablet by mouth daily.    oxymetazoline (AFRIN) 0.05 % nasal spray Place 1 spray into both nostrils 2 (two) times daily. 01/26/2022: Used this am 9:00   [DISCONTINUED] Vitamin D, Ergocalciferol, (DRISDOL) 1.25 MG (50000 UNIT) CAPS capsule Take 1 capsule (50,000 Units total) by mouth every 7 (seven) days. (Patient not taking: Reported on 04/30/2021)    No facility-administered encounter medications on file as of 01/26/2022.   No Known Allergies  ROS:  no fever, chills, myalgias, chest pain, shortness of breath. Intermittent sore throat. +PND, runny nose. No headache or sinus pain. No n/v/d. Fatigued today. +L sided neck pain mentioned at end of  visit.   PHYSICAL EXAM:  BP 130/80   Pulse 68   Temp 98.9 F (37.2 C) (Tympanic)   Ht 5' 7.5" (1.715 m)   Wt 249 lb (112.9 kg)   LMP 01/15/2022   BMI 38.42 kg/m   Wt Readings from Last 3 Encounters:  01/26/22 249 lb (112.9 kg)  06/04/21 252 lb 6.4 oz (114.5 kg)  04/30/21 251 lb (113.9 kg)   Well-appearing female.  She has some congestion and throat-clearing during visit. No coughing. She is speaking easily, and in no distress HEENT: conjunctiva and sclera are clear, EOMI.  TM's and EAC's normal. Nose with some clear drainage, and yellow mucus and crusting noted. Coughed up some thin yellow mucus during visit. Sinuses are nontender Neck:  no lymphadenopathy or mass Heart: regular rate and rhythm Lungs: clear bilaterally Mood: anxious/worried/concerned, distrustful of what I had to say. Neuro: alert and oriented, cranial nerves grossly intact, normal gait.   Rapid COVID negative Influenza A&B negative   ASSESSMENT/PLAN:  Viral URI - likely virus shared by her son. Reassured rapid COVID test accurate at this point. Supportive measures reviewed, and s/sx bacterial infection. f/u if worse  Runny nose - Plan: POC COVID-19, Influenza A/B  Nasal congestion - Plan: POC COVID-19, Influenza A/B  Patient had difficulty believing the negative rapid COVID test. Discussed that we see false negatives earlier on in the course, not now, and that PCR isn't necessary.  (And there would be no difference in treatment). I think there was some misunderstanding--I was trying to explain  that yellow mucus didn't mean that it was bacterial or needed antibiotics, and I think she thought I was saying it was "normal". Advised she had viral URI, and reviewed normal course of URI's, and the signs/sx of bacterial infections. To return if sx persist/worsen.  She  mentioned neck pain as I was out the door, reporting that she would be happy to return to discuss at another visit, with her PCP, but also  asking many questions despite saying so.   She was concerned it could be cancer. Reported pain was L posterior neck, and had been ongoing for a while. I advised her that I couldn't really give an assessment--suspected likely muscular and not cancer (she questioned why--advised no abnormal lymph nodes, but that further full exam would be needed, and encouraged her to schedule a visit with her new PCP).    Continue to take Mucinex DM twice daily. Add in taking pseudoephedrine to help with sinus congestion and drainage. Do not use Afrin for more than 2 days or you may get a rebound effect (more nasal congestion). Stay well hydrated. Contact us later this week if you are getting worse rather than better (continued discolored mucus or phlegm, sinus pain, fever, worsening cough, shortness of breath, etc.

## 2022-01-26 NOTE — Patient Instructions (Signed)
  Continue to take Mucinex DM twice daily. Add in taking pseudoephedrine to help with sinus congestion and drainage. Do not use Afrin for more than 2 days or you may get a rebound effect (more nasal congestion). Stay well hydrated. Contact us later this week if you are getting worse rather than better (continued discolored mucus or phlegm, sinus pain, fever, worsening cough, shortness of breath, etc.

## 2022-07-10 NOTE — Addendum Note (Signed)
Encounter addended by: Earlyne Iba, RN on: 07/10/2022 9:18 AM  Actions taken: Clinical Note Signed

## 2023-09-09 ENCOUNTER — Emergency Department (HOSPITAL_BASED_OUTPATIENT_CLINIC_OR_DEPARTMENT_OTHER): Payer: Self-pay

## 2023-09-09 ENCOUNTER — Other Ambulatory Visit: Payer: Self-pay

## 2023-09-09 ENCOUNTER — Encounter (HOSPITAL_BASED_OUTPATIENT_CLINIC_OR_DEPARTMENT_OTHER): Payer: Self-pay | Admitting: Emergency Medicine

## 2023-09-09 ENCOUNTER — Emergency Department (HOSPITAL_BASED_OUTPATIENT_CLINIC_OR_DEPARTMENT_OTHER)
Admission: EM | Admit: 2023-09-09 | Discharge: 2023-09-09 | Disposition: A | Discharge status: Home / Self Care | Payer: Self-pay | Attending: Emergency Medicine | Admitting: Emergency Medicine

## 2023-09-09 DIAGNOSIS — R519 Headache, unspecified: Secondary | ICD-10-CM | POA: Diagnosis present

## 2023-09-09 DIAGNOSIS — G43811 Other migraine, intractable, with status migrainosus: Secondary | ICD-10-CM | POA: Insufficient documentation

## 2023-09-09 LAB — CBC WITH DIFFERENTIAL/PLATELET
Abs Immature Granulocytes: 0.05 K/uL (ref 0.00–0.07)
Basophils Absolute: 0 K/uL (ref 0.0–0.1)
Basophils Relative: 0 %
Eosinophils Absolute: 0.2 K/uL (ref 0.0–0.5)
Eosinophils Relative: 2 %
HCT: 34.5 % — ABNORMAL LOW (ref 36.0–46.0)
Hemoglobin: 9.6 g/dL — ABNORMAL LOW (ref 12.0–15.0)
Immature Granulocytes: 0 %
Lymphocytes Relative: 22 %
Lymphs Abs: 2.4 K/uL (ref 0.7–4.0)
MCH: 18.8 pg — ABNORMAL LOW (ref 26.0–34.0)
MCHC: 27.8 g/dL — ABNORMAL LOW (ref 30.0–36.0)
MCV: 67.6 fL — ABNORMAL LOW (ref 80.0–100.0)
Monocytes Absolute: 0.5 K/uL (ref 0.1–1.0)
Monocytes Relative: 5 %
Neutro Abs: 7.9 K/uL — ABNORMAL HIGH (ref 1.7–7.7)
Neutrophils Relative %: 71 %
Platelets: 281 K/uL (ref 150–400)
RBC: 5.1 MIL/uL (ref 3.87–5.11)
RDW: 23.4 % — ABNORMAL HIGH (ref 11.5–15.5)
Smear Review: NORMAL
WBC: 11.1 K/uL — ABNORMAL HIGH (ref 4.0–10.5)
nRBC: 0 % (ref 0.0–0.2)

## 2023-09-09 LAB — BASIC METABOLIC PANEL WITH GFR
Anion gap: 13 (ref 5–15)
BUN: 8 mg/dL (ref 6–20)
CO2: 22 mmol/L (ref 22–32)
Calcium: 9.3 mg/dL (ref 8.9–10.3)
Chloride: 103 mmol/L (ref 98–111)
Creatinine, Ser: 0.92 mg/dL (ref 0.44–1.00)
GFR, Estimated: >60 mL/min (ref >60)
Glucose, Bld: 90 mg/dL (ref 70–99)
Potassium: 3.7 mmol/L (ref 3.5–5.1)
Sodium: 137 mmol/L (ref 135–145)

## 2023-09-09 LAB — HCG, SERUM, QUALITATIVE: Preg, Serum: NEGATIVE

## 2023-09-09 MED ORDER — SODIUM CHLORIDE 0.9 % IV BOLUS
500.0000 mL | Freq: Once | INTRAVENOUS | Status: AC
  Administered 2023-09-09: 500 mL via INTRAVENOUS

## 2023-09-09 MED ORDER — IOHEXOL 350 MG/ML SOLN
100.0000 mL | Freq: Once | INTRAVENOUS | Status: AC | PRN
  Administered 2023-09-09: 75 mL via INTRAVENOUS

## 2023-09-09 MED ORDER — METOCLOPRAMIDE HCL 5 MG/ML IJ SOLN
10.0000 mg | Freq: Once | INTRAMUSCULAR | Status: AC
  Administered 2023-09-09: 10 mg via INTRAVENOUS
  Filled 2023-09-09: qty 2

## 2023-09-09 MED ORDER — DEXAMETHASONE SODIUM PHOSPHATE 10 MG/ML IJ SOLN
10.0000 mg | Freq: Once | INTRAMUSCULAR | Status: AC
  Administered 2023-09-09: 10 mg via INTRAVENOUS
  Filled 2023-09-09: qty 1

## 2023-09-09 MED ORDER — KETOROLAC TROMETHAMINE 15 MG/ML IJ SOLN
15.0000 mg | Freq: Once | INTRAMUSCULAR | Status: AC
  Administered 2023-09-09: 15 mg via INTRAVENOUS
  Filled 2023-09-09: qty 1

## 2023-09-09 MED ORDER — LACTATED RINGERS IV BOLUS
1000.0000 mL | Freq: Once | INTRAVENOUS | Status: AC
  Administered 2023-09-09: 1000 mL via INTRAVENOUS

## 2023-09-09 NOTE — Discharge Instructions (Addendum)
 The blood work shows the anemia you are already aware of so keep taking your iron.  The scan of the brain and neck today were normal.  No aneurysms, brain tumors or other findings.  These may be migraine headaches that you are having but you will need to follow up with Neurology for further evaluation and treatment.  If you develop any visual changes, weakness on 1 side of the body or trouble walking return to the ER.  Someone from neurology should call you in the next week for follow up

## 2023-09-09 NOTE — ED Provider Notes (Signed)
 Emergency Department Provider Note   I have reviewed the triage vital signs and the nursing notes.   HISTORY  Chief Complaint Headache and Numbness   HPI Tiffany Sullivan is a 33 y.o. female with past history reviewed below who presents emergency department with throbbing left-sided, posterior headache.  Symptoms have been present for the past week.  She has been taking meds at home with little to no relief.  No photophobia.  Today she sneezed and suddenly felt severe, sharp pain to the left posterior part of her head with pins/needles tingling and numbness into her left jaw and lip.  No upper face symptoms.  No arm or leg numbness or weakness.  No speech disturbance.  No vision changes.  Pain has since decreased but can still feel some discomfort. No fever.    Past Medical History:  Diagnosis Date   Allergy    Anemia    Fibroid    1 small located on top of ovary   GERD (gastroesophageal reflux disease)    Syncope and collapse     Review of Systems  Constitutional: No fever/chills Cardiovascular: Denies chest pain. Respiratory: Denies shortness of breath. Gastrointestinal: No abdominal pain.  No nausea, no vomiting. Skin: Negative for rash. Neurological: Positive HA and left face numbness.   ____________________________________________   PHYSICAL EXAM:  VITAL SIGNS: ED Triage Vitals  Encounter Vitals Group     BP 09/09/23 1308 137/84     Pulse Rate 09/09/23 1308 92     Resp 09/09/23 1308 16     Temp 09/09/23 1308 98.3 F (36.8 C)     Temp src --      SpO2 09/09/23 1308 100 %     Weight 09/09/23 1306 247 lb (112 kg)     Height 09/09/23 1306 5' 7.5 (1.715 m)   Constitutional: Alert and oriented. Well appearing and in no acute distress. Eyes: Conjunctivae are normal. PERRL. EOMI. Head: Atraumatic. Nose: No congestion/rhinnorhea. Mouth/Throat: Mucous membranes are moist.   Neck: No stridor. No meningismus.  Cardiovascular: Normal rate, regular rhythm. Good  peripheral circulation. Grossly normal heart sounds.   Respiratory: Normal respiratory effort.  No retractions. Lungs CTAB. Gastrointestinal: Soft and nontender. No distention.  Musculoskeletal: No lower extremity tenderness nor edema. No gross deformities of extremities. Neurologic:  Normal speech and language. No gross focal neurologic deficits are appreciated.  Skin:  Skin is warm, dry and intact. No rash noted.  ____________________________________________   LABS (all labs ordered are listed, but only abnormal results are displayed)  Labs Reviewed  CBC WITH DIFFERENTIAL/PLATELET - Abnormal; Notable for the following components:      Result Value   WBC 11.1 (*)    Hemoglobin 9.6 (*)    HCT 34.5 (*)    MCV 67.6 (*)    MCH 18.8 (*)    MCHC 27.8 (*)    RDW 23.4 (*)    Neutro Abs 7.9 (*)    All other components within normal limits  BASIC METABOLIC PANEL WITH GFR  HCG, SERUM, QUALITATIVE    ____________________________________________   PROCEDURES  Procedure(s) performed:   Procedures  None ____________________________________________   INITIAL IMPRESSION / ASSESSMENT AND PLAN / ED COURSE  Pertinent labs & imaging results that were available during my care of the patient were reviewed by me and considered in my medical decision making (see chart for details).   This patient is Presenting for Evaluation of HA, which does require a range of treatment options, and is  a complaint that involves a high risk of morbidity and mortality.  The Differential Diagnoses includes but is not exclusive to subarachnoid hemorrhage, meningitis, encephalitis, previous head trauma, cavernous venous thrombosis, muscle tension headache, glaucoma, temporal arteritis, migraine or migraine equivalent, etc.   Critical Interventions-    Medications  sodium chloride  0.9 % bolus 500 mL (0 mLs Intravenous Stopped 09/09/23 1504)  iohexol  (OMNIPAQUE ) 350 MG/ML injection 100 mL (75 mLs Intravenous  Contrast Given 09/09/23 1508)  metoCLOPramide  (REGLAN ) injection 10 mg (10 mg Intravenous Given 09/09/23 1723)  dexamethasone  (DECADRON ) injection 10 mg (10 mg Intravenous Given 09/09/23 1723)  lactated ringers  bolus 1,000 mL (0 mLs Intravenous Stopped 09/09/23 1838)  ketorolac  (TORADOL ) 15 MG/ML injection 15 mg (15 mg Intravenous Given 09/09/23 1723)    Reassessment after intervention:  symptoms improved.     Clinical Laboratory Tests Ordered, included BMP without AKI. Mild anemia. Pregnancy negative.   Radiologic Tests Ordered, included CTA head/neck. I independently interpreted the images and are awaiting radiology interpretation.   Cardiac Monitor Tracing which shows NSR.    Social Determinants of Health Risk patient is a non-smoker.   Medical Decision Making: Summary:  Patient presents to the emergency department for evaluation of acute onset headache.  She has had ongoing headache for the past week but sudden/severe pain after sneezing just prior to ED evaluation.  She is neuro intact.  No fever.  Given atypical, sudden onset severe pain plan for CTA head and neck.  Reevaluation with update and discussion with patient. Care transferred to Dr.Plunkett.   Patient's presentation is most consistent with acute presentation with potential threat to life or bodily function.   Disposition: pending  ____________________________________________  FINAL CLINICAL IMPRESSION(S) / ED DIAGNOSES  Final diagnoses:  Other migraine with status migrainosus, intractable    Note:  This document was prepared using Dragon voice recognition software and may include unintentional dictation errors.  Fonda Law, MD, [facility] Emergency Medicine    Bellamy Rubey, Fonda MATSU, MD 09/10/23 [phone]

## 2023-09-09 NOTE — ED Triage Notes (Signed)
 Pt POV c/o posterior headache and new numbness to L cheek.  Reports sneezing earlier today and felt sudden sharp pain in L side of head.   Pt speaking in full sentences, no slurred speech, no facial droop noted.   Took excedrin last night, some relief.

## 2023-09-09 NOTE — ED Provider Notes (Signed)
 I assumed care from the patient at 330.  Patient with headaches that have been present now for 1 year but have been intermittent.  She reports recently now she has been taking Excedrin every day but today she had unusual sensation in the left side of her face.  That has now completely resolved but she is still having a bad headache.  She denies any visual changes.  She does report that she has followed up with her PCP and they were working on her neurology referral. I have independently visualized and interpreted pt's images today.  CTA of the head and neck because negative for acute pathology.  Radiology report reports that head and neck are negative.  This was discussed with the patient.  She was given a headache cocktail.  She does have noted anemia on her blood work but she reports she recently started iron for this.  She also had recently started vitamin D  but takes no other medications. Improved after meds.    Doretha Folks, MD 09/09/23 1754

## 2023-09-09 NOTE — ED Notes (Signed)

## 2023-09-09 NOTE — ED Notes (Signed)
 Patient transported to X-ray

## 2023-09-17 ENCOUNTER — Encounter: Payer: Self-pay | Admitting: Neurology

## 2023-09-17 ENCOUNTER — Ambulatory Visit (INDEPENDENT_AMBULATORY_CARE_PROVIDER_SITE_OTHER): Admitting: Neurology

## 2023-09-17 VITALS — BP 126/82 | HR 75 | Ht 67.5 in | Wt 243.4 lb

## 2023-09-17 DIAGNOSIS — R0683 Snoring: Secondary | ICD-10-CM | POA: Diagnosis not present

## 2023-09-17 DIAGNOSIS — Z9189 Other specified personal risk factors, not elsewhere classified: Secondary | ICD-10-CM

## 2023-09-17 DIAGNOSIS — E669 Obesity, unspecified: Secondary | ICD-10-CM

## 2023-09-17 DIAGNOSIS — R519 Headache, unspecified: Secondary | ICD-10-CM

## 2023-09-17 DIAGNOSIS — E236 Other disorders of pituitary gland: Secondary | ICD-10-CM | POA: Diagnosis not present

## 2023-09-17 DIAGNOSIS — G932 Benign intracranial hypertension: Secondary | ICD-10-CM

## 2023-09-17 DIAGNOSIS — G4489 Other headache syndrome: Secondary | ICD-10-CM

## 2023-09-17 DIAGNOSIS — H539 Unspecified visual disturbance: Secondary | ICD-10-CM

## 2023-09-17 NOTE — Progress Notes (Signed)
 Subjective:    Patient ID: Tiffany Sullivan is a 33 y.o. female.  HPI    True Mar, MD, PhD [facility] [address], [address] [address] [place], [place] [address]  I saw patient, Latara Slavens, as a referral from the emergency room for evaluation of her headaches.  The patient is unaccompanied today.  Ms. Woodring is a 34 year old female with an underlying medical history of vitamin D  deficiency, allergies, anemia, fibroid disease, reflux disease, history of syncope, and obesity, who reports a history of recurrent headaches for the past year or 2.  In the past 3 weeks her headaches have become worse.  She has mostly left-sided headache and headache in the back, it feels like a pressure, sometimes she has nausea but no vomiting, sometimes she has seen some floaters but no sustained visual disturbance, no loss of vision, no blurry vision consistently, no double vision.  She has had an eye examination with [facility] in the beginning part of this year.  She has eyeglasses and contact lenses, does not use either on a consistent basis she admits.  She snores, she has not had a sleep study.  She has woken up with a headache.  Her Epworth sleepiness score is 2 out of 24, fatigue severity score is 41 out of 63.  She is frustrated, she is scared that she may have a serious condition and has not received any explanation or help.  She works as a Contractor.  She drinks about a liter of water per day.  She drinks 1 cup of coffee per day, she takes Excedrin to treat her headaches which helps.  She has not had any sudden onset of one-sided weakness or numbness or tingling or droopy face or slurring of speech.  She did not have migraines growing up.  She lives with her family including husband and 51-year-old son.  She presented to the emergency room on 09/09/2023 with a 1 week history of headache, she reported an acute exacerbation of her headaches suddenly which prompted her to  present to the emergency room.  I reviewed the ER records.  She had a CT angiogram of the head and neck with and without contrast on 09/09/2023 and I reviewed the results:   IMPRESSION: 1. Normal CT of the head. 2. Normal CT angiogram of the neck. 3. Normal CT angiogram of the head.    In addition, I personally and independently reviewed images through the PACS system.  A partially empty sella was noted.  She was treated symptomatically with Reglan , Decadron , Toradol , and IV fluids. She also had a head CT without contrast through [facility] on 08/30/2023 and I reviewed the results:   IMPRESSION:   No acute intracranial abnormality.   Findings of a partially empty sella and prominence of the optic nerve sheaths. While nonspecific, these findings together raise the possibility of idiopathic intracranial hypertension. Consider lumbar puncture with opening pressures if not previously performed.   IMPRESSION:   No acute intracranial abnormality.   Findings of a partially empty sella and prominence of the optic nerve sheaths. While nonspecific, these findings together raise the possibility of idiopathic intracranial hypertension. Consider lumbar puncture with opening pressures if not previously performed.   Of note, she has been referred by her primary care to [facility] neurology.   Her Past Medical History Is Significant For: Past Medical History:  Diagnosis Date   Allergy    Anemia    Fibroid  1 small located on top of ovary   GERD (gastroesophageal reflux disease)    Syncope and collapse     Her Past Surgical History Is Significant For: Past Surgical History:  Procedure Laterality Date   NO PAST SURGERIES      Her Family History Is Significant For: Family History  Problem Relation Age of Onset   Breast cancer Maternal Aunt 50   Kidney disease Maternal Aunt    Diabetes Maternal Aunt    Diabetes Maternal Grandfather    Breast cancer Paternal Grandmother 30    Stroke Paternal Grandfather    Migraines Neg Hx     Her Social History Is Significant For: Social History   Socioeconomic History   Marital status: Single    Spouse name: n/a   Number of children: 0   Years of education: Not on file   Highest education level: Not on file  Occupational History   Occupation: cafeteria    Employer: [employer]  Tobacco Use   Smoking status: Never   Smokeless tobacco: Never  Vaping Use   Vaping status: Never Used  Substance and Sexual Activity   Alcohol use: Yes    Alcohol/week: 3.0 standard drinks of alcohol    Types: 3 Shots of liquor per week    Comment: drinks alcohol on the weekends. (social)   Drug use: Yes    Types: Marijuana    Comment: last used >3 weeks ago (marijuana) for anxiety   Sexual activity: Not Currently    Partners: Male  Other Topics Concern   Not on file  Social History Narrative   Lives with husband and son.   No pets, no smoke exposure.   Teacher   Caffiene 1 cup daily   Social Drivers of Health   Financial Resource Strain: Not on file  Food Insecurity: Low Risk  (08/24/2023)   Received from [facility]   Hunger Vital Sign    Within the past 12 months, you worried that your food would run out before you got money to buy more: Never true    Within the past 12 months, the food you bought just didn't last and you didn't have money to get more. : Never true  Transportation Needs: No Transportation Needs (08/24/2023)   Received from [facility]    In the past 12 months, has lack of reliable transportation kept you from medical appointments, meetings, work or from getting things needed for daily living? : No  Physical Activity: Not on file  Stress: Not on file  Social Connections: Not on file    Her Allergies Are:  No Known Allergies:   Her Current Medications Are:  Outpatient Encounter Medications as of 09/17/2023  Medication Sig   acetaminophen  (TYLENOL ) 500 MG tablet Take 1,000 mg by  mouth every 8 (eight) hours as needed.   aspirin-acetaminophen -caffeine (EXCEDRIN MIGRAINE) 250-250-65 MG tablet Take by mouth every 6 (six) hours as needed for headache.   ferrous sulfate  325 (65 FE) MG EC tablet Take 325 mg by mouth daily with breakfast.   dextromethorphan -guaiFENesin (MUCINEX DM) 30-600 MG 12hr tablet Take 1 tablet by mouth 2 (two) times daily.   ergocalciferol  (VITAMIN D2) 1.25 MG (50000 UT) capsule Take 50,000 Units by mouth once a week. (Patient not taking: Reported on 09/17/2023)   loratadine (CLARITIN) 10 MG tablet Take 10 mg by mouth daily.   Multiple Vitamins-Minerals (MULTIVITAMIN WITH MINERALS) tablet Take 1 tablet by mouth daily. (Patient not taking: Reported on  09/17/2023)   oxymetazoline (AFRIN) 0.05 % nasal spray Place 1 spray into both nostrils 2 (two) times daily.   No facility-administered encounter medications on file as of 09/17/2023.  :   Review of Systems:  Out of a complete 14 point review of systems, all are reviewed and negative with the exception of these symptoms as listed below:  Review of Systems  Neurological:        Here for headaches.  Going on for a year, but worse last 2-3 wks.  L head /face numbness. Some morning headaches.  Has been to ED had imaging.  No family that she is aware of.   ESS  FSS      Objective:  Neurological Exam  Physical Exam Physical Examination:   Vitals:   09/17/23 0810  BP: 126/82  Pulse: 75    General Examination: The patient is a very pleasant 33 y.o. female in no acute distress. She appears well-developed and well-nourished and well groomed.  She is anxious appearing.  HEENT: Normocephalic, atraumatic, pupils are equal, round and reactive to light, extraocular tracking is good without limitation to gaze excursion or nystagmus noted.  No photophobia, funduscopic exam largely benign, not completely sure about disc margins on the left.  Hearing is grossly intact. Face is symmetric with normal facial animation  and normal facial sensation to light touch, temperature and vibration sense. Speech is clear with no dysarthria noted. There is no hypophonia. There is no lip, neck/head, jaw or voice tremor. Neck is supple with full range of passive and active motion. There are no carotid bruits on auscultation. Oropharynx exam reveals: mild mouth dryness, good dental hygiene and mild airway crowding, due to small airway entry.  Mallampati class II.  Chest: Clear to auscultation without wheezing, rhonchi or crackles noted.  Heart: S1+S2+0, regular and normal without murmurs, rubs or gallops noted.   Abdomen: Soft, non-tender and non-distended.  Extremities: There is no pitting edema in the distal lower extremities bilaterally.   Skin: Warm and dry without trophic changes noted.   Musculoskeletal: exam reveals no obvious joint deformities.   Neurologically:  Mental status: The patient is awake, alert and oriented in all 4 spheres. Her immediate and remote memory, attention, language skills and fund of knowledge are appropriate. There is no evidence of aphasia, agnosia, apraxia or anomia. Speech is clear with normal prosody and enunciation. Thought process is linear. Mood is normal and affect is normal.  Cranial nerves II - XII are as described above under HEENT exam.  Motor exam: Normal bulk, strength and tone is noted. There is no obvious action or resting tremor.  No drift or rebound, no postural or intention tremor. Reflexes 2+ throughout, toes are downgoing bilaterally.  Romberg negative. Fine motor skills and coordination:  intact finger taps, hand movements and rapid alternating patting with both upper extremities, normal foot taps bilaterally in the lower extremities.  Cerebellar testing: No dysmetria or intention tremor. There is no truncal or gait ataxia.  Normal finger-to-nose, normal heel-to-shin bilaterally. Sensory exam: intact to light touch, temperature and vibration sense in the upper and lower  extremities.  Gait, station and balance: She stands easily. No veering to one side is noted. No leaning to one side is noted. Posture is age-appropriate and stance is narrow based. Gait shows normal stride length and normal pace. No problems turning are noted.  Normal tandem walk.  Assessment and Plan:  In summary, Jeffie G Bilyk is a very pleasant 33 year old female with  an underlying medical history of vitamin D  deficiency, allergies, anemia, fibroid disease, reflux disease, history of syncope, and obesity, who presents for evaluation of her recurrent headaches of approximately 1 to 2 years duration and recent finding of partially empty sella, some visual disturbance reported, increased risk for sleep disordered breathing as well.  I had a long discussion with the patient today, we talked about partially empty sella, IIH risk, OSA risk and recurrent headaches including triggers and alleviating factors, possible treatment options for these conditions down the road.  Below is a summary of my recommendations and our discussion points based on today's visit, chart review, record review including tests and her history as well as neurological and general examination.  She was given these instructions verbally in detail during the visit and also in writing in her after visit summary which she can access electronically:  << 1. We will do a brain scan, called MRI and another MR scan called MR venogram.  We will call you with the test results. We will have to schedule you for this on a separate date. These tests requires authorization from your insurance, and we will take care of the insurance process.  2.  I would like for you to get a formal eye exam.  I will make a referral to ophthalmology. 3. We will do a LP (lumbar puncture/spinal tap) with pressure testing on your spinal fluid and routine fluid testing. We will call you with the results.  4.  Eventually, if your spinal fluid pressure is indeed elevated,  we will have you start a medication called diamox to help keep your spinal fluid pressure at bay.  Some people need more than 1 spinal tap over time. 5. Your vision and visual fields meaning peripheral vision can be affected. The most serious complication of having pseudotumor cerebri aka idiopathic intracranial hypertension is loss of vision, which can be permanent. Work up, at least initially with the eye specialist should include: best corrected visual acuity, formal visual field testing, dilated fundus examination with optic disc photographs, and often optical coherence tomography (OCT) of the optic nerve, retinal nerve fiber layer, and macular ganglion cell layer. Worsening vision is an indication for intensifying treatment.  6.  Ultimately, some people need to be considered for a shunt in the future (to prevent fluid pressure from building up over and over).  A small tubing will be placed into one of the fluid cavities in your brain to introduce a permanent small drainage.  This is done through neurosurgery. 7. We will also do a sleep study to rule out obstructive sleep apnea.  If you have obstructive sleep apnea, I will want you to start treatment with a machine called CPAP or AutoPAP.  Treatment of obstructive sleep apnea can help headaches, also help with metabolism and weight loss, and ultimately, treatment of sleep apnea can reduce risk for cardiovascular complications including heart disease and stroke risk. 8.  Please work on weight loss. 9.  Stop using caffeine completely including Excedrin and drinking coffee for now. 10. Increase your water intake to about 2 L of water per day. >>   This was an extended visit of over 60 minutes of high complexity, due to copious record review involved, addressing multiple issues, considerable counseling and coordination of care.   True Mar, MD, PhD

## 2023-09-17 NOTE — Patient Instructions (Signed)
 It was nice to meet you today.   As discussed, your headaches are likely due to a combination of factors.   Here is what we discussed today and my recommendations for you:   You were found to have a partially empty sella on the CT scans of your head.  This is typically an incidental finding but can be associated with a condition called idiopathic intracranial hypertension (IIH) aka a condition called pseudotumor cerebri.  This means that there may be an increased fluid pressure around your brain, resulting in pressure on your brain, which can cause headaches, and pressure on the eye nerve(s), which can cause blurry vision, even loss of vision.   1. We will do a brain scan, called MRI and another MR scan called MR venogram.  We will call you with the test results. We will have to schedule you for this on a separate date. These tests requires authorization from your insurance, and we will take care of the insurance process.  2.  I would like for you to get a formal eye exam.  I will make a referral to ophthalmology. 3. We will do a LP (lumbar puncture/spinal tap) with pressure testing on your spinal fluid and routine fluid testing. We will call you with the results.  4.  Eventually, if your spinal fluid pressure is indeed elevated, we will have you start a medication called diamox to help keep your spinal fluid pressure at bay.  Some people need more than 1 spinal tap over time. 5. Your vision and visual fields meaning peripheral vision can be affected. The most serious complication of having pseudotumor cerebri aka idiopathic intracranial hypertension is loss of vision, which can be permanent. Work up, at least initially with the eye specialist should include: best corrected visual acuity, formal visual field testing, dilated fundus examination with optic disc photographs, and often optical coherence tomography (OCT) of the optic nerve, retinal nerve fiber layer, and macular ganglion cell layer.  Worsening vision is an indication for intensifying treatment.  6.  Ultimately, some people need to be considered for a shunt in the future (to prevent fluid pressure from building up over and over).  A small tubing will be placed into one of the fluid cavities in your brain to introduce a permanent small drainage.  This is done through neurosurgery. 7. We will also do a sleep study to rule out obstructive sleep apnea.  If you have obstructive sleep apnea, I will want you to start treatment with a machine called CPAP or AutoPAP.  Treatment of obstructive sleep apnea can help headaches, also help with metabolism and weight loss, and ultimately, treatment of sleep apnea can reduce risk for cardiovascular complications including heart disease and stroke risk. 8.  Please work on weight loss. 9.  Stop using caffeine completely including Excedrin and drinking coffee for now. 10. Increase your water intake to about 2 L of water per day.

## 2023-09-20 ENCOUNTER — Telehealth: Payer: Self-pay | Admitting: Neurology

## 2023-09-20 NOTE — Addendum Note (Signed)
 Addended by: NEYSA NENA RAMAN on: 09/20/2023 02:53 PM   Modules accepted: Orders

## 2023-09-20 NOTE — Telephone Encounter (Signed)
 Referral for ophthalmology fax to [facility], Phone: [phone], Fax: [phone]

## 2023-09-21 ENCOUNTER — Telehealth: Payer: Self-pay | Admitting: Neurology

## 2023-09-21 NOTE — Telephone Encounter (Signed)
 [employer] state no auth required sent to GI with the lumbar puncture. [phone]

## 2023-09-23 ENCOUNTER — Encounter: Payer: Self-pay | Admitting: Neurology

## 2023-09-24 ENCOUNTER — Telehealth: Payer: Self-pay | Admitting: Neurology

## 2023-09-24 NOTE — Telephone Encounter (Signed)
 09/24/23 sent mychart [facility] NPSG/[facility] [employer] no auth req [facility]

## 2023-09-29 NOTE — Discharge Instructions (Signed)
 Lumbar Puncture Discharge Instructions  Go home and rest quietly as needed. You may resume normal activities; however, do not exert yourself strongly or do any heavy lifting today and tomorrow.   DO NOT drive today.    You may resume your normal diet and medications unless otherwise indicated. Drink lots of extra fluids today and tomorrow.   The incidence of headache, nausea, or vomiting is about 5% (one in 20 patients).  If you develop a headache, lie flat for 24 hours and drink plenty of fluids until the headache goes away.  Caffeinated beverages may be helpful. If when you get up you still have a headache when standing, go back to bed and force fluids for another 24 hours.   If you develop severe nausea and vomiting or a headache that does not go away with the flat bedrest after 48 hours, please call [phone].   Call your physician for a follow-up appointment.  The results of your Lumbar Puncture will be sent directly to your physician and they will contact you.   If you have any questions or if complications develop after you arrive home, please call [phone].  Discharge instructions have been explained to the patient.  The patient, or the person responsible for the patient, fully understands these instructions.   Thank you for visiting our office today.   **AVOID ASPIRIN CONTAINING PRODUCTS, SUCH AS EXCEDRIN**

## 2023-10-01 ENCOUNTER — Ambulatory Visit
Admission: RE | Admit: 2023-10-01 | Discharge: 2023-10-01 | Disposition: A | Discharge status: Home / Self Care | Source: Ambulatory Visit | Attending: Neurology | Admitting: Neurology

## 2023-10-01 ENCOUNTER — Ambulatory Visit: Payer: Self-pay | Admitting: Neurology

## 2023-10-01 VITALS — BP 141/90 | HR 72 | Resp 18

## 2023-10-01 DIAGNOSIS — H539 Unspecified visual disturbance: Secondary | ICD-10-CM

## 2023-10-01 DIAGNOSIS — R0683 Snoring: Secondary | ICD-10-CM

## 2023-10-01 DIAGNOSIS — E669 Obesity, unspecified: Secondary | ICD-10-CM

## 2023-10-01 DIAGNOSIS — E66811 Obesity, class 1: Secondary | ICD-10-CM

## 2023-10-01 DIAGNOSIS — E236 Other disorders of pituitary gland: Secondary | ICD-10-CM

## 2023-10-01 DIAGNOSIS — G4489 Other headache syndrome: Secondary | ICD-10-CM

## 2023-10-01 DIAGNOSIS — R519 Headache, unspecified: Secondary | ICD-10-CM

## 2023-10-01 DIAGNOSIS — Z9189 Other specified personal risk factors, not elsewhere classified: Secondary | ICD-10-CM

## 2023-10-01 DIAGNOSIS — G932 Benign intracranial hypertension: Secondary | ICD-10-CM

## 2023-10-02 ENCOUNTER — Encounter (HOSPITAL_BASED_OUTPATIENT_CLINIC_OR_DEPARTMENT_OTHER): Payer: Self-pay | Admitting: Emergency Medicine

## 2023-10-02 ENCOUNTER — Other Ambulatory Visit: Payer: Self-pay

## 2023-10-02 ENCOUNTER — Emergency Department (HOSPITAL_BASED_OUTPATIENT_CLINIC_OR_DEPARTMENT_OTHER)

## 2023-10-02 ENCOUNTER — Emergency Department (HOSPITAL_BASED_OUTPATIENT_CLINIC_OR_DEPARTMENT_OTHER)
Admission: EM | Admit: 2023-10-02 | Discharge: 2023-10-03 | Disposition: A | Discharge status: Home / Self Care | Attending: Emergency Medicine | Admitting: Emergency Medicine

## 2023-10-02 DIAGNOSIS — G43909 Migraine, unspecified, not intractable, without status migrainosus: Secondary | ICD-10-CM | POA: Diagnosis not present

## 2023-10-02 DIAGNOSIS — R519 Headache, unspecified: Secondary | ICD-10-CM | POA: Diagnosis present

## 2023-10-02 MED ORDER — DEXAMETHASONE SODIUM PHOSPHATE 10 MG/ML IJ SOLN
10.0000 mg | Freq: Once | INTRAMUSCULAR | Status: AC
  Administered 2023-10-02: 10 mg via INTRAVENOUS
  Filled 2023-10-02: qty 1

## 2023-10-02 MED ORDER — LACTATED RINGERS IV BOLUS
1000.0000 mL | Freq: Once | INTRAVENOUS | Status: AC
  Administered 2023-10-02: 1000 mL via INTRAVENOUS

## 2023-10-02 MED ORDER — PROCHLORPERAZINE EDISYLATE 10 MG/2ML IJ SOLN
10.0000 mg | Freq: Once | INTRAMUSCULAR | Status: AC
  Administered 2023-10-02: 10 mg via INTRAVENOUS
  Filled 2023-10-02: qty 2

## 2023-10-02 MED ORDER — DIPHENHYDRAMINE HCL 50 MG/ML IJ SOLN
50.0000 mg | Freq: Once | INTRAMUSCULAR | Status: AC
  Administered 2023-10-02: 50 mg via INTRAVENOUS
  Filled 2023-10-02: qty 1

## 2023-10-02 NOTE — ED Triage Notes (Incomplete)
 Pt reports she had an outpt LP yesterday; when she is upright, she c/o severe HA and is vomiting

## 2023-10-02 NOTE — ED Notes (Signed)
 Patient transported to CT

## 2023-10-02 NOTE — ED Provider Notes (Signed)
 [place] [facility] Provider Note   CSN: [id number] Arrival date & time: 10/02/23  2244     History Chief Complaint  Patient presents with   Emesis   Headache    HPI Tiffany Sullivan is a 33 y.o. female presenting for headache after having an LP done yesterday.  Notably, she had the LP done due to a year of headaches. Denies any new focal neurologic deficits but states she has had nausea throughout the day today with poor p.o. intake.  Denies fevers chills syncope shortness of breath otherwise.  Denies any neck pain. Ambulatory today.  Patient's recorded medical, surgical, social, medication list and allergies were reviewed in the Snapshot window as part of the initial history.   Review of Systems   Review of Systems  Constitutional:  Negative for chills and fever.  HENT:  Negative for ear pain and sore throat.   Eyes:  Negative for pain and visual disturbance.  Respiratory:  Negative for cough and shortness of breath.   Cardiovascular:  Negative for chest pain and palpitations.  Gastrointestinal:  Negative for abdominal pain and vomiting.  Genitourinary:  Negative for dysuria and hematuria.  Musculoskeletal:  Negative for arthralgias and back pain.  Skin:  Negative for color change and rash.  Neurological:  Positive for headaches. Negative for seizures and syncope.  All other systems reviewed and are negative.   Physical Exam Updated Vital Signs BP 115/68   Pulse 90   Temp 98.5 F (36.9 C) (Oral)   Resp 16   Ht 5' 7.5 (1.715 m)   Wt 110.4 kg   LMP 08/26/2023 (Approximate)   SpO2 98%   BMI 37.56 kg/m  Physical Exam Vitals and nursing note reviewed.  Constitutional:      General: She is not in acute distress.    Appearance: She is well-developed.  HENT:     Head: Normocephalic and atraumatic.  Eyes:     Conjunctiva/sclera: Conjunctivae normal.  Cardiovascular:     Rate and Rhythm: Normal rate and regular rhythm.     Heart  sounds: No murmur heard. Pulmonary:     Effort: Pulmonary effort is normal. No respiratory distress.     Breath sounds: Normal breath sounds.  Abdominal:     General: There is no distension.     Palpations: Abdomen is soft.     Tenderness: There is no abdominal tenderness. There is no right CVA tenderness or left CVA tenderness.  Musculoskeletal:        General: No swelling or tenderness. Normal range of motion.     Cervical back: Neck supple.  Skin:    General: Skin is warm and dry.  Neurological:     General: No focal deficit present.     Mental Status: She is alert and oriented to person, place, and time. Mental status is at baseline.     Cranial Nerves: No cranial nerve deficit.      ED Course/ Medical Decision Making/ A&P    Procedures Procedures   Medications Ordered in ED Medications  prochlorperazine  (COMPAZINE ) injection 10 mg (10 mg Intravenous Given 10/02/23 2313)  diphenhydrAMINE  (BENADRYL ) injection 50 mg (50 mg Intravenous Given 10/02/23 2311)  dexamethasone  (DECADRON ) injection 10 mg (10 mg Intravenous Given 10/02/23 2312)  lactated ringers  bolus 1,000 mL (0 mLs Intravenous Stopped 10/03/23 0111)   Medical Decision Making:   Tiffany Sullivan is a 33 y.o. female who presented to the ED today with acute on  chronic headache detailed above.    Patient placed on continuous vitals and telemetry monitoring while in ED which was reviewed periodically.   Complete initial physical exam performed, notably the patient  was HDS in NAD.    Reviewed and confirmed nursing documentation for past medical history, family history, social history.    Initial Assessment:   With the patient's presentation of headache, most likely diagnosis is post LP headache vs tension type headaches vs atypical migraines. Other diagnoses were considered including (but not limited to) intracranial mass, intracranial hemorrhage, intracranial infection including meningitis vs encephalitis, GCA, trigeminal  neuralgia. These are considered less likely due to history of present illness and physical exam findings.    This is most consistent with an acute life/limb threatening illness complicated by underlying chronic conditions.   Timeline and slow onset is not consistent with SAH/ICH   Age and description of pain is not consistent with GCA   Lack of fever,meningismus is not consistent with meningitis/encephalitis   Initial Plan:  Will initiate treatment with Compazine /Benardryll/NSAIDS/Tylenol  for treatment of nonspecific headache  Screening labs including CBC and Metabolic panel to evaluate for infectious or metabolic etiology of disease.  Urinalysis with reflex culture ordered to evaluate for UTI or relevant urologic/nephrologic pathology.  CTH to evaluate for structural IC etiology given patient's reported acute changes Objective evaluation as below reviewed   Initial Study Results:   Laboratory  All laboratory results reviewed without evidence of clinically relevant pathology.   Exceptions include: Leukocytosis, anion gap  EKG EKG was reviewed independently. Rate, rhythm, axis, intervals all examined and without medically relevant abnormality. ST segments without concerns for elevations.    Radiology:  All images reviewed independently. Agree with radiology report at this time.   CT HEAD WO CONTRAST ( )  Final Result        Final Assessment and Plan:   Patient observed for 5 hours in the emergency room.  Headache completely resolved on serial reassessment. She feels much better has been able to tolerate p.o. intake.  Likely has an anion gap secondary to dehydration and lactic acid from substantial vomiting.  This is likely also the etiology of her leukocytosis.  She does not have meningismus at this time and was just tested for any infectious intracranial pathology with no focal findings. Given improvement in syndrome, I believe patient to be stably followed up in the outpatient  setting for ongoing care management follow-up with PCP/neurology.  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  I recommended she inform her neurology team of her symptoms tonight and they can schedule her for a blood patch if headache is recurrent.  Clinical Impression:  1. Migraine without status migrainosus, not intractable, unspecified migraine type      Discharge   Final Clinical Impression(s) / ED Diagnoses Final diagnoses:  Migraine without status migrainosus, not intractable, unspecified migraine type    Rx / DC Orders ED Discharge Orders          Ordered    metoCLOPramide  (REGLAN ) 10 MG tablet  Every 6 hours        10/03/23 0346              Jerral Meth, MD 10/03/23 [phone]

## 2023-10-03 LAB — CBC WITH DIFFERENTIAL/PLATELET
Abs Granulocyte: 13.8 K/uL — ABNORMAL HIGH (ref 1.5–6.5)
Abs Immature Granulocytes: 0.06 K/uL (ref 0.00–0.07)
Basophils Absolute: 0 K/uL (ref 0.0–0.1)
Basophils Relative: 0 %
Eosinophils Absolute: 0 K/uL (ref 0.0–0.5)
Eosinophils Relative: 0 %
HCT: 39.4 % (ref 36.0–46.0)
Hemoglobin: 11.4 g/dL — ABNORMAL LOW (ref 12.0–15.0)
Immature Granulocytes: 0 %
Lymphocytes Relative: 6 %
Lymphs Abs: 0.9 K/uL (ref 0.7–4.0)
MCH: 19.8 pg — ABNORMAL LOW (ref 26.0–34.0)
MCHC: 28.9 g/dL — ABNORMAL LOW (ref 30.0–36.0)
MCV: 68.5 fL — ABNORMAL LOW (ref 80.0–100.0)
Monocytes Absolute: 0.4 K/uL (ref 0.1–1.0)
Monocytes Relative: 2 %
Neutro Abs: 13.8 K/uL — ABNORMAL HIGH (ref 1.7–7.7)
Neutrophils Relative %: 92 %
Platelets: 383 K/uL (ref 150–400)
RBC: 5.75 MIL/uL — ABNORMAL HIGH (ref 3.87–5.11)
RDW: 21.8 % — ABNORMAL HIGH (ref 11.5–15.5)
Smear Review: NORMAL
WBC: 15.1 K/uL — ABNORMAL HIGH (ref 4.0–10.5)
nRBC: 0 % (ref 0.0–0.2)

## 2023-10-03 LAB — PREGNANCY, URINE: Preg Test, Ur: NEGATIVE

## 2023-10-03 LAB — BASIC METABOLIC PANEL WITH GFR
Anion gap: 17 — ABNORMAL HIGH (ref 5–15)
BUN: 10 mg/dL (ref 6–20)
CO2: 20 mmol/L — ABNORMAL LOW (ref 22–32)
Calcium: 9.8 mg/dL (ref 8.9–10.3)
Chloride: 101 mmol/L (ref 98–111)
Creatinine, Ser: 0.83 mg/dL (ref 0.44–1.00)
GFR, Estimated: >60 mL/min (ref >60)
Glucose, Bld: 118 mg/dL — ABNORMAL HIGH (ref 70–99)
Potassium: 4 mmol/L (ref 3.5–5.1)
Sodium: 138 mmol/L (ref 135–145)

## 2023-10-03 MED ORDER — METOCLOPRAMIDE HCL 10 MG PO TABS: 10.0000 mg | ORAL_TABLET | Freq: Four times a day (QID) | ORAL | 0 refills | Status: AC

## 2023-10-03 NOTE — ED Notes (Signed)
 Pt ambulates independently to restroom at this time to obtain a UA

## 2023-10-03 NOTE — ED Notes (Signed)
 During ambulation to bathroom. Pt noted her symptoms were returning. The pressure in the head, motion sickness.  Pt was returned to room and laid flat to which she noted the symptoms were going away.

## 2023-10-03 NOTE — ED Notes (Signed)
 ED Provider at bedside.

## 2023-10-04 ENCOUNTER — Other Ambulatory Visit

## 2023-10-04 ENCOUNTER — Ambulatory Visit
Admission: RE | Admit: 2023-10-04 | Discharge: 2023-10-04 | Disposition: A | Discharge status: Home / Self Care | Source: Ambulatory Visit | Attending: Neurology | Admitting: Neurology

## 2023-10-04 ENCOUNTER — Telehealth: Payer: Self-pay | Admitting: Neurology

## 2023-10-04 DIAGNOSIS — R519 Headache, unspecified: Secondary | ICD-10-CM

## 2023-10-04 DIAGNOSIS — G971 Other reaction to spinal and lumbar puncture: Secondary | ICD-10-CM

## 2023-10-04 DIAGNOSIS — E236 Other disorders of pituitary gland: Secondary | ICD-10-CM

## 2023-10-04 MED ORDER — IOPAMIDOL (ISOVUE-M 200) INJECTION 41%
1.0000 mL | Freq: Once | INTRAMUSCULAR | Status: AC
  Administered 2023-10-04 (×2): 1 mL via EPIDURAL

## 2023-10-04 NOTE — Telephone Encounter (Signed)
 Pt called to request callback the patient states that Friday PT  had lumbar puncture  and have been  feeling a lot of pain in her head  Pt states that the  headache have gotten worse to the point  Pt can't . Stand nor sit down  . Pt  has lost appetite and has been feeling very nauseas . Pt  spoke to someone in [facility] and they instructed Pt that  they can do a head blood tact that should fill up leak . Pt would need order from MD  to get that MRI done  as soon as possible .   I don't have no appetites nauseas as well .Also  Had to go to ER because of  dehydration as well.

## 2023-10-04 NOTE — Discharge Instructions (Signed)
 Blood Patch Discharge Instructions ? ?Go home and rest quietly for the next 24 hours.  It is important to lie flat for the next 24 hours.  Get up only to go to the restroom.  You may lie in the bed or on a couch on your back, your stomach, your left side or your right side.  You may have one pillow under your head.  You may have pillows between your knees while you are on your side or under your knees while you are on your back. ? ?DO NOT drive today.  Recline the seat as far back as it will go, while still wearing your seat belt, on the way home. ? ?You may get up to go to the bathroom as needed.  You may sit up for 10 minutes to eat.  You may resume your normal diet and medications unless otherwise indicated.  Drink lots of extra fluids today and tomorrow..  ? ?You may resume normal activities after your 24 hours of bed rest is over; however, do not exert yourself strongly or do any heavy lifting tomorrow. ? ?Call your physician for a follow-up appointment.  ? ?If you have any questions  after you arrive home, please call [phone]. ? ?Discharge instructions have been explained to the patient.  The patient, or the person responsible for the patient, fully understands these instructions. ? ?   ?

## 2023-10-04 NOTE — Telephone Encounter (Signed)
 Blood patch order signed, patient may want to delay MRI by a few days.

## 2023-10-04 NOTE — Telephone Encounter (Signed)
 Spoke to patient per dr Buck ok to get blood patch . Per patient request did call [facility] to notify them patient getting blood patch and has to be on bedrest for 24 hours Pt was suppose to have MRI brain tomorrow. Per [facility] will call patient today and reschedule . Will send order to Dr Buck for blood patch

## 2023-10-04 NOTE — Telephone Encounter (Signed)
 Please read advise only phone note listed above

## 2023-10-04 NOTE — Progress Notes (Deleted)
1 vial of blood drawn from pts Left hand to be sent off with LP lab work. 1 successful attempt, pt tolerated well. Gauze and tape applied after.

## 2023-10-04 NOTE — Progress Notes (Signed)
 20cc blood collected from pts Left hand prior to blood patch procedure. Pt tolerated well. 1 successful attempt. Gauze and tape applied after.

## 2023-10-05 ENCOUNTER — Other Ambulatory Visit

## 2023-10-06 LAB — CSF CULTURE W GRAM STAIN
MICRO NUMBER:: 16805980
Result:: NO GROWTH
SPECIMEN QUALITY:: ADEQUATE

## 2023-10-06 LAB — GLUCOSE, CSF: Glucose, CSF: 59 mg/dL (ref 40–80)

## 2023-10-06 LAB — CSF CELL COUNT WITH DIFFERENTIAL
RBC Count, CSF: 0 {cells}/uL
TOTAL NUCLEATED CELL: 3 {cells}/uL (ref 0–5)

## 2023-10-06 LAB — PROTEIN, CSF: Total Protein, CSF: 36 mg/dL (ref 15–45)

## 2023-10-06 NOTE — Telephone Encounter (Signed)
 Spoke with the patient.  She is scheduled at [facility] for 10/24/2023 atr 8 pm.  Mailed packet and sent mychart

## 2023-10-14 ENCOUNTER — Ambulatory Visit
Admission: RE | Admit: 2023-10-14 | Discharge: 2023-10-14 | Disposition: A | Discharge status: Home / Self Care | Source: Ambulatory Visit | Attending: Neurology | Admitting: Neurology

## 2023-10-14 ENCOUNTER — Inpatient Hospital Stay
Admission: RE | Admit: 2023-10-14 | Discharge: 2023-10-14 | Discharge status: Home / Self Care | Source: Ambulatory Visit | Attending: Neurology | Admitting: Neurology

## 2023-10-14 DIAGNOSIS — E236 Other disorders of pituitary gland: Secondary | ICD-10-CM

## 2023-10-14 DIAGNOSIS — G932 Benign intracranial hypertension: Secondary | ICD-10-CM

## 2023-10-14 DIAGNOSIS — R0683 Snoring: Secondary | ICD-10-CM

## 2023-10-14 DIAGNOSIS — R519 Headache, unspecified: Secondary | ICD-10-CM

## 2023-10-14 DIAGNOSIS — E669 Obesity, unspecified: Secondary | ICD-10-CM

## 2023-10-14 DIAGNOSIS — G4489 Other headache syndrome: Secondary | ICD-10-CM

## 2023-10-14 DIAGNOSIS — H539 Unspecified visual disturbance: Secondary | ICD-10-CM

## 2023-10-14 DIAGNOSIS — Z9189 Other specified personal risk factors, not elsewhere classified: Secondary | ICD-10-CM

## 2023-10-14 MED ORDER — GADOPICLENOL 0.5 MMOL/ML IV SOLN
10.0000 mL | Freq: Once | INTRAVENOUS | Status: AC | PRN
  Administered 2023-10-14: 10 mL via INTRAVENOUS

## 2023-10-19 ENCOUNTER — Telehealth: Payer: Self-pay | Admitting: Neurology

## 2023-10-19 NOTE — Telephone Encounter (Signed)
 FYI:  I received ophthalmology records from a visit with Dr. Lonni Gaudy from 09/27/2023.  Impression/plan: Headache, empty sella on CT.  No optic nerve edema.  Follow-up in 1 year was recommended.    I will have the full visit note scanned into her chart.

## 2023-10-24 ENCOUNTER — Encounter

## 2023-10-31 ENCOUNTER — Ambulatory Visit (INDEPENDENT_AMBULATORY_CARE_PROVIDER_SITE_OTHER): Admitting: Neurology

## 2023-10-31 DIAGNOSIS — R0683 Snoring: Secondary | ICD-10-CM

## 2023-10-31 DIAGNOSIS — Z9189 Other specified personal risk factors, not elsewhere classified: Secondary | ICD-10-CM

## 2023-10-31 DIAGNOSIS — G932 Benign intracranial hypertension: Secondary | ICD-10-CM

## 2023-10-31 DIAGNOSIS — H539 Unspecified visual disturbance: Secondary | ICD-10-CM

## 2023-10-31 DIAGNOSIS — G4733 Obstructive sleep apnea (adult) (pediatric): Secondary | ICD-10-CM

## 2023-10-31 DIAGNOSIS — R519 Headache, unspecified: Secondary | ICD-10-CM

## 2023-10-31 DIAGNOSIS — G4489 Other headache syndrome: Secondary | ICD-10-CM

## 2023-10-31 DIAGNOSIS — E669 Obesity, unspecified: Secondary | ICD-10-CM

## 2023-10-31 DIAGNOSIS — E236 Other disorders of pituitary gland: Secondary | ICD-10-CM

## 2023-10-31 DIAGNOSIS — G4720 Circadian rhythm sleep disorder, unspecified type: Secondary | ICD-10-CM

## 2023-11-09 NOTE — Procedures (Unsigned)
 Physician Interpretation: Please see link under Procedure Tab or under Encounters tab for physician report, technical report, as well as O2 titration and/or PAP titration tables (if applicable).   Referred by: Dr. Modena Callander   History and Indication for Testing (obtained from visit note dated 08/26/2023): 33 year old female with an underlying complex medical history of stroke in May 2025, hypertension, hyperlipidemia, coronary artery disease, prior smoking, COPD, history of kidney stone, carotid artery disease with status post bilateral carotid endarterectomies, hypothyroidism, and overweight state, who reports snoring and nocturia. His Epworth sleepiness score is 3 out of 24, fatigue severity score is 15 out of 63.     Review of the EEG showed no abnormal electrical discharges and symmetrical bihemispheric findings.     EKG: The EKG revealed normal sinus rhythm (NSR). ***   AUDIO/VIDEO REVIEW: The audio and video review did not show any abnormal or unusual behaviors, movements, phonations or vocalizations. The patient took *** restroom breaks. Snoring was noted, ***   POST-STUDY QUESTIONNAIRE: Post study, the patient indicated, that sleep was *** the same as usual.    IMPRESSION:    Obstructive Sleep Apnea (OSA), *** ***Central Sleep Apnea (CSA) ***Primary Snoring ***Primary Central Sleep Apnea ***Complex Sleep Apnea ***PLMD (periodic limb movement disorder [of sleep]) ***Dysfunctions associated with sleep stages or arousal from sleep ***Non-specific abnormal electrocardiogram (EKG) ***Poor sleep pattern ***Inconclusive Test   RECOMMENDATIONS:         I certify that I have reviewed the entire raw data recording prior to the issuance of this report in accordance with the Standards of Accreditation of the American Academy of Sleep Medicine (AASM).   True Mar, MD, PhD Medical Director, [facility]) Diplomat, ABPN (Neurology and  Sleep)

## 2023-11-12 NOTE — Telephone Encounter (Signed)
-----   Message from True Mar sent at 11/11/2023  5:47 PM EDT ----- Pls offer FU with NP for HAs. ----- Message ----- From: Mar True, MD Sent: 11/11/2023   5:43 PM EDT To: True Mar, MD

## 2023-11-12 NOTE — Telephone Encounter (Signed)
 LVM for patient to call back and review sleep study results

## 2023-11-16 NOTE — Progress Notes (Signed)
 Called pt and LVM (ok per DPR) asking for call back during office hours to schedule a follow-up with NP for headaches.

## 2023-11-22 NOTE — Telephone Encounter (Signed)
-----   Message from True Mar sent at 11/11/2023  5:47 PM EDT ----- Pls offer FU with NP for HAs. ----- Message ----- From: Mar True, MD Sent: 11/11/2023   5:43 PM EDT To: True Mar, MD

## 2023-11-22 NOTE — Telephone Encounter (Signed)
 I called spoke to pt and relayed results of her sleep study, she had seen on mychart her results.  I offered her an appt with NP for her headaches.  She said she is taking otc but not as much. I relayed about that taking too much OTC pain relievers can cause rebound.   She will call back to schedule.

## 2024-04-16 IMAGING — MG MM BREAST LOCALIZATION CLIP
4 series · 4 of 12 positions shown · non-contrast
Comparison: Previous exam(s).

CLINICAL DATA: Post biopsy mammogram of the right breast for clip
placement.

EXAM:
3D DIAGNOSTIC RIGHT MAMMOGRAM POST ULTRASOUND BIOPSY

[R ML synth-2D]
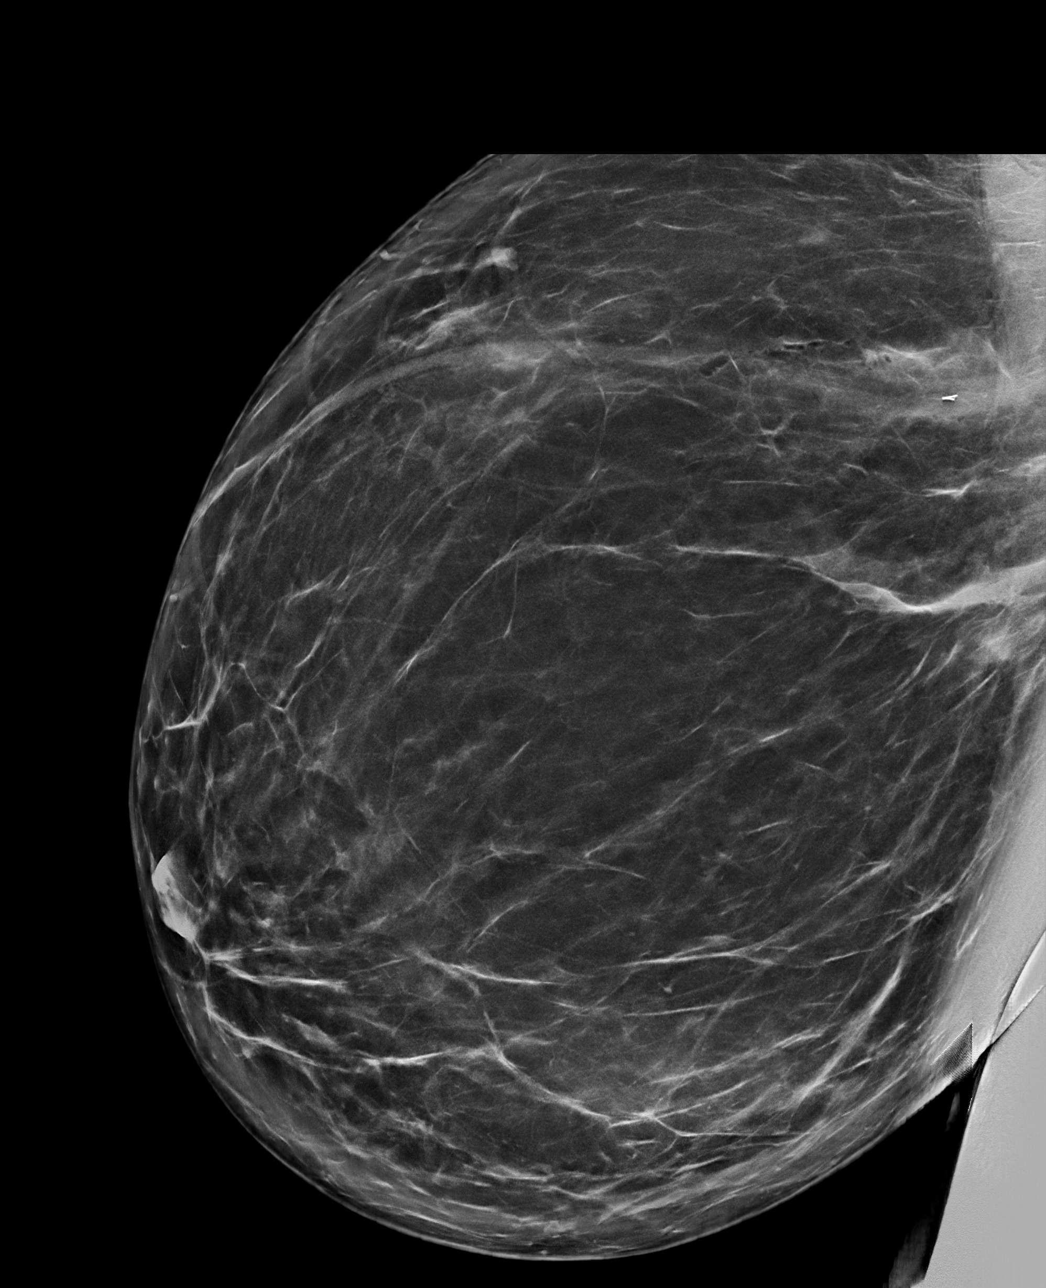

[R CC synth-2D]
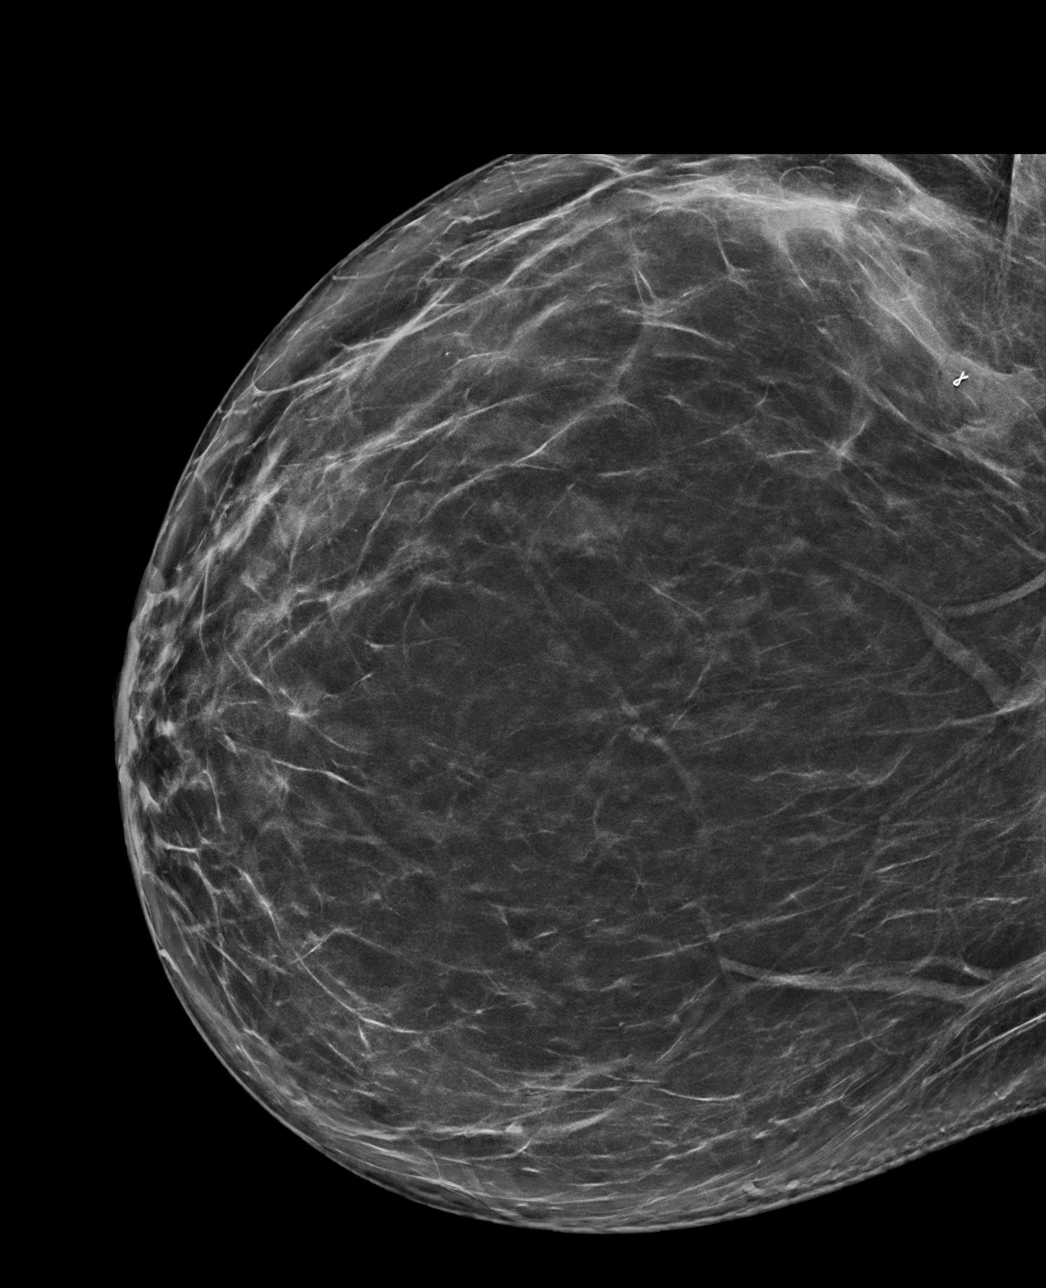

[R ML tomo · tomo slice 50/99.0]
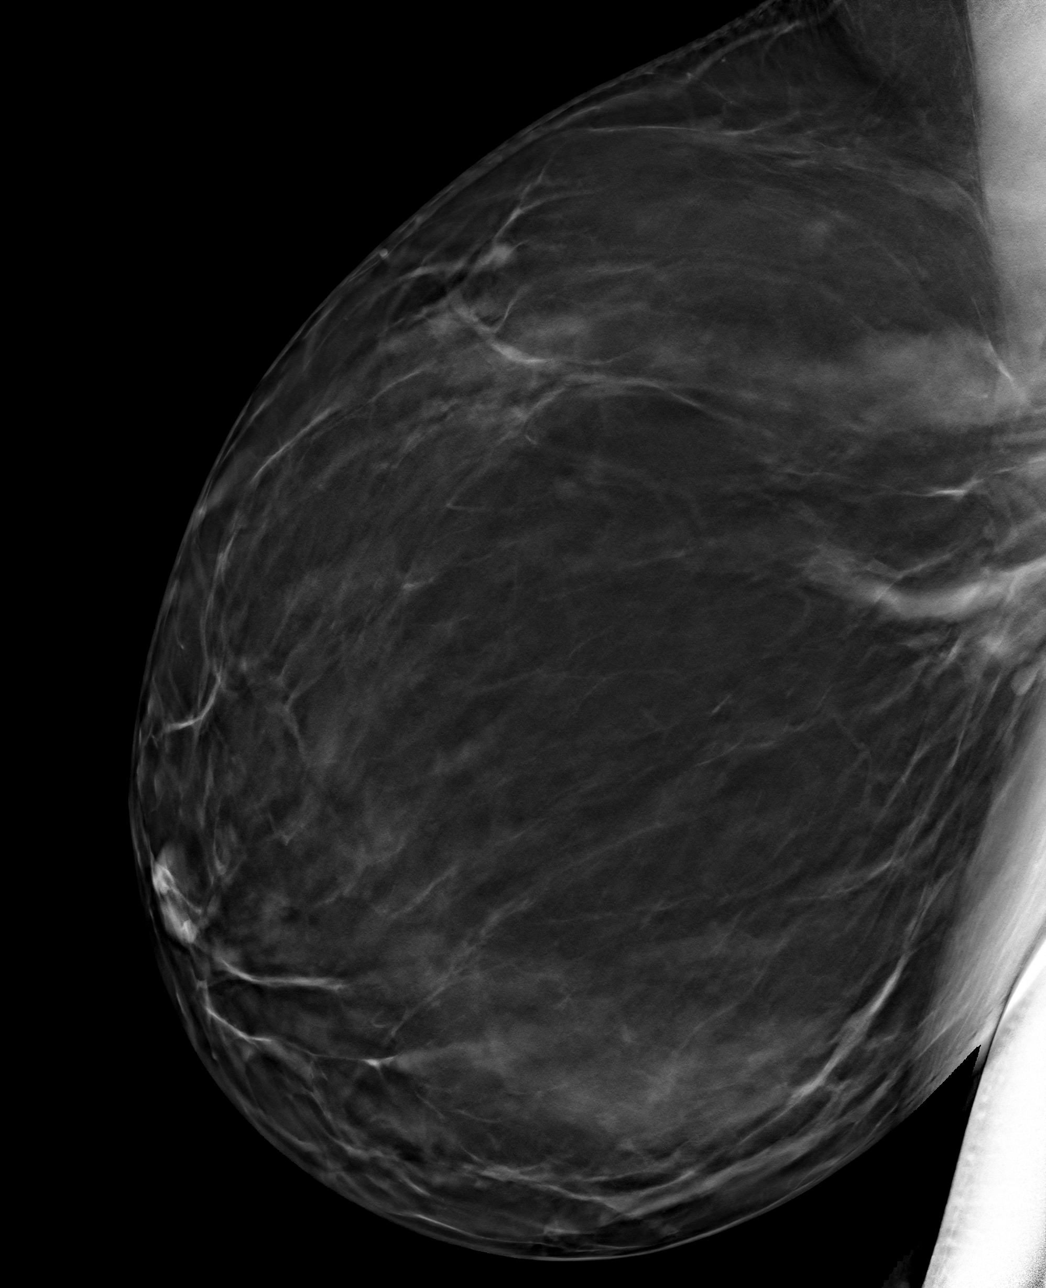

[R CC tomo · tomo slice 47/93.0]
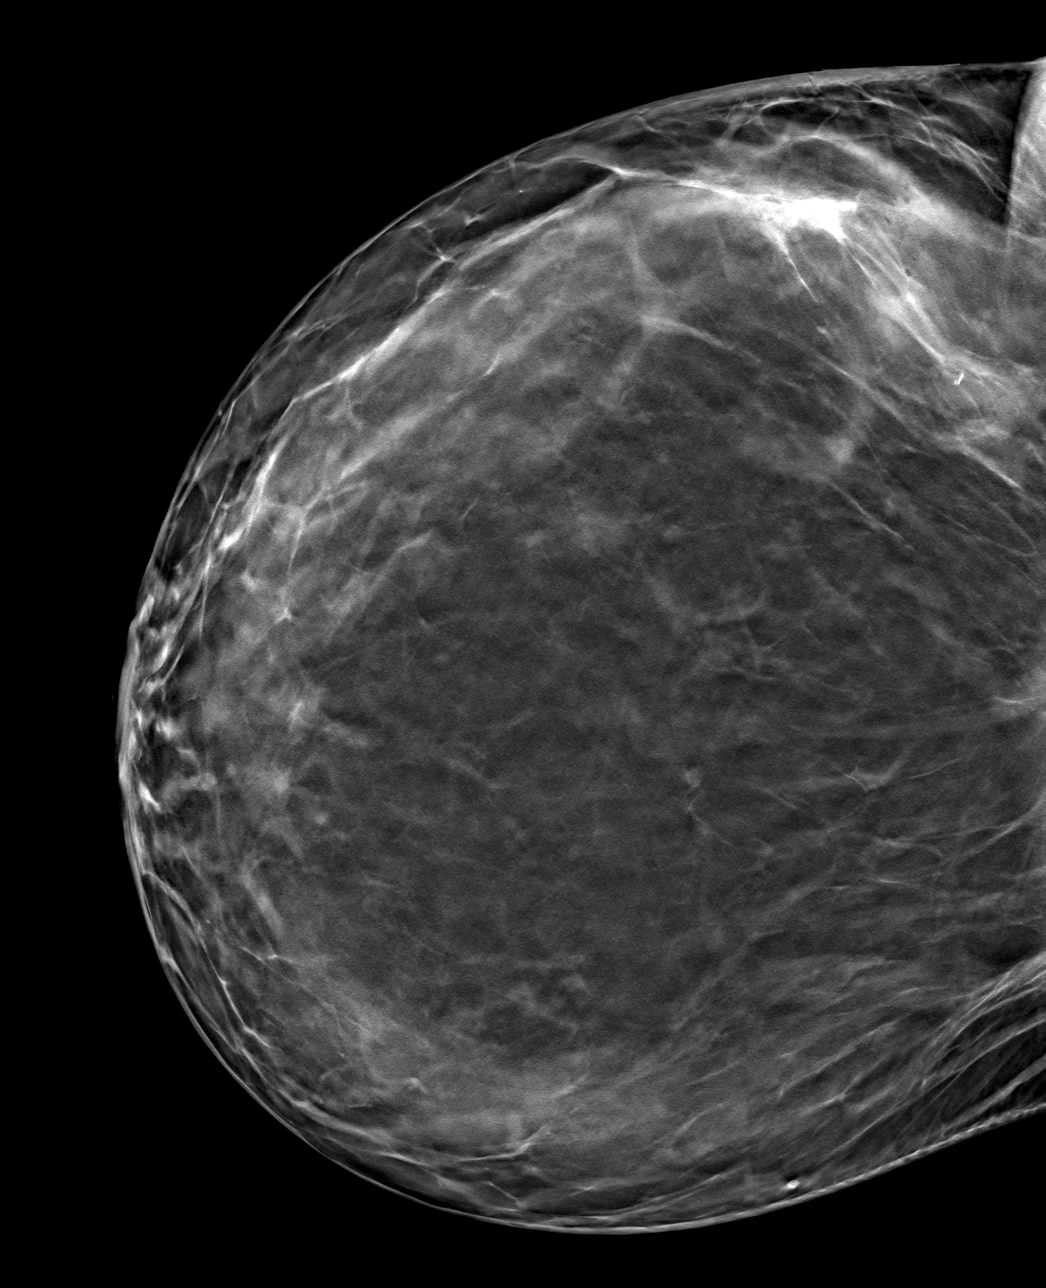

[4 of 12 positions shown; findings below may reference images not displayed]

FINDINGS: 3D Mammographic images were obtained following ultrasound guided
biopsy of a mass in the right breast at 10 o'clock. The biopsy
marking clip is in expected position at the site of biopsy.
IMPRESSION: Appropriate positioning of the ribbon shaped biopsy marking clip at
the site of biopsy in the upper-outer right breast.

Final Assessment: Post Procedure Mammograms for Marker Placement
